# Patient Record
Sex: Male | Born: 1955
Health system: Southern US, Community
[De-identification: ages and names within clinical notes are randomized; demographics above are authoritative.]

## PROBLEM LIST (undated history)

## (undated) DIAGNOSIS — F1910 Other psychoactive substance abuse, uncomplicated: Secondary | ICD-10-CM

## (undated) DIAGNOSIS — I10 Essential (primary) hypertension: Secondary | ICD-10-CM

## (undated) DIAGNOSIS — B1920 Unspecified viral hepatitis C without hepatic coma: Secondary | ICD-10-CM

## (undated) DIAGNOSIS — I219 Acute myocardial infarction, unspecified: Secondary | ICD-10-CM

## (undated) DIAGNOSIS — E7800 Pure hypercholesterolemia, unspecified: Secondary | ICD-10-CM

## (undated) HISTORY — PX: HERNIA REPAIR: SHX51

---

## 2004-06-16 ENCOUNTER — Emergency Department (HOSPITAL_COMMUNITY): Admission: EM | Admit: 2004-06-16 | Discharge: 2004-06-16 | Payer: Self-pay | Admitting: Emergency Medicine

## 2009-01-05 ENCOUNTER — Emergency Department (HOSPITAL_BASED_OUTPATIENT_CLINIC_OR_DEPARTMENT_OTHER): Admission: EM | Admit: 2009-01-05 | Discharge: 2009-01-05 | Payer: Self-pay | Admitting: Emergency Medicine

## 2013-04-18 ENCOUNTER — Encounter (HOSPITAL_BASED_OUTPATIENT_CLINIC_OR_DEPARTMENT_OTHER): Payer: Self-pay | Admitting: Family Medicine

## 2013-04-18 ENCOUNTER — Emergency Department (HOSPITAL_BASED_OUTPATIENT_CLINIC_OR_DEPARTMENT_OTHER)
Admission: EM | Admit: 2013-04-18 | Discharge: 2013-04-18 | Disposition: A | Discharge status: Other Acute Inpatient Hospital | Payer: Medicare Other | Attending: Emergency Medicine | Admitting: Emergency Medicine

## 2013-04-18 ENCOUNTER — Emergency Department (HOSPITAL_BASED_OUTPATIENT_CLINIC_OR_DEPARTMENT_OTHER): Payer: Medicare Other

## 2013-04-18 DIAGNOSIS — I252 Old myocardial infarction: Secondary | ICD-10-CM | POA: Insufficient documentation

## 2013-04-18 DIAGNOSIS — F17200 Nicotine dependence, unspecified, uncomplicated: Secondary | ICD-10-CM | POA: Insufficient documentation

## 2013-04-18 DIAGNOSIS — IMO0002 Reserved for concepts with insufficient information to code with codable children: Secondary | ICD-10-CM | POA: Insufficient documentation

## 2013-04-18 DIAGNOSIS — Z8639 Personal history of other endocrine, nutritional and metabolic disease: Secondary | ICD-10-CM | POA: Insufficient documentation

## 2013-04-18 DIAGNOSIS — Z7982 Long term (current) use of aspirin: Secondary | ICD-10-CM | POA: Insufficient documentation

## 2013-04-18 DIAGNOSIS — Z8619 Personal history of other infectious and parasitic diseases: Secondary | ICD-10-CM | POA: Insufficient documentation

## 2013-04-18 DIAGNOSIS — I10 Essential (primary) hypertension: Secondary | ICD-10-CM | POA: Insufficient documentation

## 2013-04-18 DIAGNOSIS — Z862 Personal history of diseases of the blood and blood-forming organs and certain disorders involving the immune mechanism: Secondary | ICD-10-CM | POA: Insufficient documentation

## 2013-04-18 DIAGNOSIS — Z79899 Other long term (current) drug therapy: Secondary | ICD-10-CM | POA: Insufficient documentation

## 2013-04-18 DIAGNOSIS — L0291 Cutaneous abscess, unspecified: Secondary | ICD-10-CM

## 2013-04-18 HISTORY — DX: Essential (primary) hypertension: I10

## 2013-04-18 HISTORY — DX: Acute myocardial infarction, unspecified: I21.9

## 2013-04-18 HISTORY — DX: Unspecified viral hepatitis C without hepatic coma: B19.20

## 2013-04-18 HISTORY — DX: Pure hypercholesterolemia, unspecified: E78.00

## 2013-04-18 LAB — BASIC METABOLIC PANEL
CO2: 25 mEq/L (ref 19–32)
Creatinine, Ser: 0.9 mg/dL (ref 0.50–1.35)
GFR calc Af Amer: >90 mL/min (ref >90)
GFR calc non Af Amer: >90 mL/min (ref >90)

## 2013-04-18 LAB — CBC WITH DIFFERENTIAL/PLATELET
Basophils Relative: 0 % (ref 0–1)
Eosinophils Absolute: 0.3 10*3/uL (ref 0.0–0.7)
Eosinophils Relative: 3 % (ref 0–5)
HCT: 41.2 % (ref 39.0–52.0)
Lymphocytes Relative: 14 % (ref 12–46)
MCH: 31 pg (ref 26.0–34.0)
MCHC: 35.2 g/dL (ref 30.0–36.0)
Monocytes Absolute: 0.7 10*3/uL (ref 0.1–1.0)
Monocytes Relative: 7 % (ref 3–12)
Neutro Abs: 7.5 10*3/uL (ref 1.7–7.7)
Platelets: 123 10*3/uL — ABNORMAL LOW (ref 150–400)
RDW: 14.2 % (ref 11.5–15.5)
WBC: 9.9 10*3/uL (ref 4.0–10.5)

## 2013-04-18 MED ORDER — CLINDAMYCIN PHOSPHATE 900 MG/50ML IV SOLN
900.0000 mg | Freq: Once | INTRAVENOUS | Status: AC
  Administered 2013-04-18: 900 mg via INTRAVENOUS
  Filled 2013-04-18: qty 50

## 2013-04-18 MED ORDER — POTASSIUM CHLORIDE CRYS ER 20 MEQ PO TBCR
40.0000 meq | EXTENDED_RELEASE_TABLET | Freq: Once | ORAL | Status: AC
  Administered 2013-04-18: 40 meq via ORAL
  Filled 2013-04-18: qty 2

## 2013-04-18 MED ORDER — MORPHINE SULFATE 4 MG/ML IJ SOLN
4.0000 mg | Freq: Once | INTRAMUSCULAR | Status: AC
  Administered 2013-04-18: 4 mg via INTRAVENOUS
  Filled 2013-04-18: qty 1

## 2013-04-18 MED ORDER — SODIUM CHLORIDE 0.9 % IV BOLUS (SEPSIS)
1000.0000 mL | Freq: Once | INTRAVENOUS | Status: AC
  Administered 2013-04-18: 1000 mL via INTRAVENOUS

## 2013-04-18 MED ORDER — MORPHINE SULFATE 4 MG/ML IJ SOLN
4.0000 mg | Freq: Once | INTRAMUSCULAR | Status: DC
  Filled 2013-04-18: qty 1

## 2013-04-18 NOTE — ED Notes (Signed)
Pt somnolent at present, arouseable but no pain medication given at this time. MD aware.

## 2013-04-18 NOTE — ED Notes (Signed)
Pt c/o left pain and infection x 2 wks. Pt sts he was seen at [facility] 2 days ago and given antibiotics. Pt has open sore to left antecubital area, draining. Pt is lethargic and sts he took last pain pill at 7am.

## 2013-04-18 NOTE — ED Provider Notes (Addendum)
History     CSN: [id number]  Arrival date & time 04/18/13  9562   First MD Initiated Contact with Patient 04/18/13 1029      Chief Complaint  Patient presents with  . Wound Infection    (Consider location/radiation/quality/duration/timing/severity/associated sxs/prior treatment) HPI ComPlains of open draining painful wound to the left elbow for the past 1.5 weeks. Patient seen by Dr. Duffy Rhody yesterday prescribed Keflex and, without relief. He treated himself with hydrocodone 7.5 mg tablets this morning He reports hospitalization was recommended which he declined. Pain is worse with movement, moderate to severe. No other associated symptoms. Nonradiating Denies fever denies shortness of breath denies nausea Past Medical History  Diagnosis Date  . MI (myocardial infarction)   . Hypertension   . High cholesterol   . Hepatitis C     Past Surgical History  Procedure Laterality Date  . Hernia repair      No family history on file.  History  Substance Use Topics  . Smoking status: Current Every Day Smoker  . Smokeless tobacco: Not on file  . Alcohol Use: Yes   Denies illicit drug use   Review of Systems  Constitutional: Negative.   HENT: Negative.   Respiratory: Negative.   Cardiovascular: Negative.   Gastrointestinal: Negative.   Musculoskeletal: Negative.   Skin: Positive for wound.       Draining wound from left forearm  Neurological: Negative.   Psychiatric/Behavioral: Negative.     Allergies  Review of patient's allergies indicates no known allergies.  Home Medications   Current Outpatient Rx  Name  Route  Sig  Dispense  Refill  . aspirin 81 MG tablet   Oral   Take 81 mg by mouth daily.         . cephALEXin (KEFLEX) 500 MG capsule   Oral   Take 500 mg by mouth 4 (four) times daily.         . cyclobenzaprine (FLEXERIL) 10 MG tablet   Oral   Take 10 mg by mouth 3 (three) times daily as needed for muscle spasms.         . hydrochlorothiazide  (MICROZIDE) 12.5 MG capsule   Oral   Take 12.5 mg by mouth daily.         Marland Kitchen HYDROcodone-acetaminophen (NORCO) 7.5-325 MG per tablet   Oral   Take 1 tablet by mouth every 6 (six) hours as needed for pain.         . meloxicam (MOBIC) 15 MG tablet   Oral   Take 15 mg by mouth daily.         . metoprolol succinate (TOPROL-XL) 25 MG 24 hr tablet   Oral   Take 25 mg by mouth daily.         Marland Kitchen NIFEdipine (PROCARDIA) 10 MG capsule   Oral   Take 10 mg by mouth 3 (three) times daily.         Marland Kitchen oxyCODONE-acetaminophen (ENDOCET) 10-325 MG per tablet   Oral   Take 1 tablet by mouth every 4 (four) hours as needed for pain.         Marland Kitchen oxyCODONE-acetaminophen (PERCOCET) 10-325 MG per tablet   Oral   Take 1 tablet by mouth every 4 (four) hours as needed for pain.         Marland Kitchen sulfamethoxazole-trimethoprim (BACTRIM DS) 800-160 MG per tablet   Oral   Take 1 tablet by mouth 2 (two) times daily.         Marland Kitchen  tiZANidine (ZANAFLEX) 4 MG tablet   Oral   Take 4 mg by mouth every 6 (six) hours as needed.           BP 110/68  Pulse 79  Temp(Src) 97.7 F (36.5 C) (Oral)  Resp 16  Ht 5\' 11"  (1.803 m)  Wt 187 lb (84.823 kg)  BMI 26.09 kg/m2  SpO2 95%  Physical Exam  Nursing note and vitals reviewed. Constitutional: He appears well-developed and well-nourished. No distress.  Sleepy easily arousable to verbal stimuli  HENT:  Head: Normocephalic and atraumatic.  Eyes: Conjunctivae are normal. Pupils are equal, round, and reactive to light.  Neck: Neck supple. No tracheal deviation present. No thyromegaly present.  Cardiovascular: Normal rate and regular rhythm.   No murmur heard. Pulmonary/Chest: Effort normal and breath sounds normal.  Abdominal: Soft. Bowel sounds are normal. He exhibits no distension. There is no tenderness.  Musculoskeletal: Normal range of motion. He exhibits no edema and no tenderness.  Left upper extremity there is a 3 cm diameter open draining wound  draining cream-colored pus surrounded by approximately 10 cm hardened area which is tender. Upper arm is red and at the posterior aspect approximate 10 cm distal to the axilla down to the elbow. Proximal forearm is red approximate 5 cm distal to the elbow. There is a healing abrasion approximately 2 cm at the distal ulnar aspect of the forearm Full range of motion radial pulse 2+ good capillary refill. No axillary nodes .all other extremities without contusion abrasion or tenderness neurovascularly intact  Neurological: He is alert. Coordination normal.  Skin: Skin is warm and dry. No rash noted.  Psychiatric: He has a normal mood and affect.    ED Course  Procedures (including critical care time)  Labs Reviewed - No data to display No results found.   No diagnosis found. Results for orders placed during the hospital encounter of 04/18/13  CBC WITH DIFFERENTIAL      Result Value Range   WBC 9.9  4.0 - 10.5 K/uL   RBC 4.68  4.22 - 5.81 MIL/uL   Hemoglobin 14.5  13.0 - 17.0 g/dL   HCT 56.2  13.0 - 86.5 %   MCV 88.0  78.0 - 100.0 fL   MCH 31.0  26.0 - 34.0 pg   MCHC 35.2  30.0 - 36.0 g/dL   RDW 78.4  69.6 - 29.5 %   Platelets 123 (*) 150 - 400 K/uL   Neutrophils Relative 76  43 - 77 %   Neutro Abs 7.5  1.7 - 7.7 K/uL   Lymphocytes Relative 14  12 - 46 %   Lymphs Abs 1.4  0.7 - 4.0 K/uL   Monocytes Relative 7  3 - 12 %   Monocytes Absolute 0.7  0.1 - 1.0 K/uL   Eosinophils Relative 3  0 - 5 %   Eosinophils Absolute 0.3  0.0 - 0.7 K/uL   Basophils Relative 0  0 - 1 %   Basophils Absolute 0.0  0.0 - 0.1 K/uL  BASIC METABOLIC PANEL      Result Value Range   Sodium 141  135 - 145 mEq/L   Potassium 2.7 (*) 3.5 - 5.1 mEq/L   Chloride 107  96 - 112 mEq/L   CO2 25  19 - 32 mEq/L   Glucose, Bld 112 (*) 70 - 99 mg/dL   BUN 11  6 - 23 mg/dL   Creatinine, Ser 2.84  0.50 - 1.35 mg/dL   Calcium  7.7 (*) 8.4 - 10.5 mg/dL   GFR calc non Af Amer >90  >90 mL/min   GFR calc Af Amer >90  >90  mL/min   Dg Elbow Complete Left  04/18/2013  *RADIOLOGY REPORT*  Clinical Data: Infected open wound  LEFT ELBOW - COMPLETE 3+ VIEW  Comparison: None.  Findings: No fracture or dislocation is noted.  Joint spaces are intact. No soft tissue abnormality is noted.  No radiopaque foreign body is noted.  Spurring of the olecranon is noted.  IMPRESSION: No acute abnormality seen in left elbow.   Original Report Authenticated By: Lupita Raider.,  M.D.     Xray viewed by me  MDM  Patient requires admission for IV antibiotics. [facility] Hospice could consult him as needed. Spoke with Dr.  Memory Dance, hospitalist physician who accepts patient in transfer. Intravenous clindamycin , and potassium ordered prior to discharge Dx #1 abscess left elbow #2 hypokalemia #3 hypocalcemia #4 thrombocytopenia        Doug Sou, MD 04/18/13 1243  Doug Sou, MD 04/18/13 1244 125 1:25 PM patient requesting pain medicine. Morphine ordered  1:05 PM pain improved after treatment with morphine patient is alert and appropriate Glasgow Coma Score 15  Doug Sou, MD 04/18/13 1408

## 2015-08-07 ENCOUNTER — Encounter (HOSPITAL_BASED_OUTPATIENT_CLINIC_OR_DEPARTMENT_OTHER): Payer: Self-pay | Admitting: *Deleted

## 2015-08-07 ENCOUNTER — Emergency Department (HOSPITAL_BASED_OUTPATIENT_CLINIC_OR_DEPARTMENT_OTHER): Payer: Medicare Other

## 2015-08-07 ENCOUNTER — Emergency Department (HOSPITAL_BASED_OUTPATIENT_CLINIC_OR_DEPARTMENT_OTHER)
Admission: EM | Admit: 2015-08-07 | Discharge: 2015-08-07 | Disposition: A | Discharge status: Home / Self Care | Payer: Medicare Other | Attending: Emergency Medicine | Admitting: Emergency Medicine

## 2015-08-07 DIAGNOSIS — Y998 Other external cause status: Secondary | ICD-10-CM | POA: Diagnosis not present

## 2015-08-07 DIAGNOSIS — Y9289 Other specified places as the place of occurrence of the external cause: Secondary | ICD-10-CM | POA: Diagnosis not present

## 2015-08-07 DIAGNOSIS — Z791 Long term (current) use of non-steroidal anti-inflammatories (NSAID): Secondary | ICD-10-CM | POA: Insufficient documentation

## 2015-08-07 DIAGNOSIS — Z7982 Long term (current) use of aspirin: Secondary | ICD-10-CM | POA: Diagnosis not present

## 2015-08-07 DIAGNOSIS — S8991XA Unspecified injury of right lower leg, initial encounter: Secondary | ICD-10-CM | POA: Diagnosis present

## 2015-08-07 DIAGNOSIS — I252 Old myocardial infarction: Secondary | ICD-10-CM | POA: Insufficient documentation

## 2015-08-07 DIAGNOSIS — Z8639 Personal history of other endocrine, nutritional and metabolic disease: Secondary | ICD-10-CM | POA: Diagnosis not present

## 2015-08-07 DIAGNOSIS — M25569 Pain in unspecified knee: Secondary | ICD-10-CM

## 2015-08-07 DIAGNOSIS — W010XXA Fall on same level from slipping, tripping and stumbling without subsequent striking against object, initial encounter: Secondary | ICD-10-CM | POA: Diagnosis not present

## 2015-08-07 DIAGNOSIS — Z792 Long term (current) use of antibiotics: Secondary | ICD-10-CM | POA: Insufficient documentation

## 2015-08-07 DIAGNOSIS — I10 Essential (primary) hypertension: Secondary | ICD-10-CM | POA: Insufficient documentation

## 2015-08-07 DIAGNOSIS — Z8619 Personal history of other infectious and parasitic diseases: Secondary | ICD-10-CM | POA: Diagnosis not present

## 2015-08-07 DIAGNOSIS — Y9389 Activity, other specified: Secondary | ICD-10-CM | POA: Diagnosis not present

## 2015-08-07 DIAGNOSIS — Z79899 Other long term (current) drug therapy: Secondary | ICD-10-CM | POA: Diagnosis not present

## 2015-08-07 DIAGNOSIS — S8992XA Unspecified injury of left lower leg, initial encounter: Secondary | ICD-10-CM | POA: Insufficient documentation

## 2015-08-07 DIAGNOSIS — Z720 Tobacco use: Secondary | ICD-10-CM | POA: Diagnosis not present

## 2015-08-07 MED ORDER — DICLOFENAC EPOLAMINE 1.3 % TD PTCH: 1.0000 | MEDICATED_PATCH | Freq: Two times a day (BID) | TRANSDERMAL | Status: AC

## 2015-08-07 MED ORDER — NAPROXEN 250 MG PO TABS
500.0000 mg | ORAL_TABLET | Freq: Once | ORAL | Status: AC
  Administered 2015-08-07: 500 mg via ORAL
  Filled 2015-08-07: qty 2

## 2015-08-07 MED ORDER — METHOCARBAMOL 500 MG PO TABS
1000.0000 mg | ORAL_TABLET | Freq: Once | ORAL | Status: AC
  Administered 2015-08-07: 1000 mg via ORAL
  Filled 2015-08-07: qty 2

## 2015-08-07 NOTE — ED Notes (Signed)
Pt sitting in w/c, family at [facility], back from xray, pending results, alert, NAD, calm, interactive, drinking coffee, c/o bilateral knee pain, denies other areas of pain or other sx. "worse with movement, walking or standing". No meds PTA. Skin, CMS and ROM. Guarding movements. No obvious swelling, markings or deformity. H/o L knee surgery.

## 2015-08-07 NOTE — ED Notes (Signed)
Pt "tripped and fell" this am on a hardwoor floor.  He struck his knees and has pain in both knees now.

## 2015-08-07 NOTE — ED Notes (Signed)
Called pt x2, checked in waiting room, checked outside and called x-ray.  Can not find pt

## 2015-08-07 NOTE — ED Provider Notes (Signed)
CSN: [id number]     Arrival date & time 08/07/15  2113 History  This chart was scribed for Kareem Cathey, MD by Placido Sou, ED scribe. This patient was seen in room MHFT1/MHFT1 and the patient's care was started at 11:11 PM.   Chief Complaint  Patient presents with  . Fall   Patient is a 59 y.o. male presenting with fall. The history is provided by the patient. No language interpreter was used.  Fall This is a new problem. The current episode started 12 to 24 hours ago. The problem occurs constantly. The problem has not changed since onset.Pertinent negatives include no chest pain, no abdominal pain, no headaches and no shortness of breath. The symptoms are aggravated by walking and standing. Nothing relieves the symptoms. He has tried rest for the symptoms. The treatment provided no relief.   HPI Comments: Thomas White is a 59 y.o. male who presents to the Emergency Department complaining of constant, mild, bilateral knee pain with onset at 11:00 am this morning. Pt notes tripping and falling on a hardwood floor landing on his knees resulting in his current symptoms. He notes a shx to his left knee that was 20 years ago. He denies any known drug allergies.   Past Medical History  Diagnosis Date  . MI (myocardial infarction)   . Hypertension   . High cholesterol   . Hepatitis C    Past Surgical History  Procedure Laterality Date  . Hernia repair     No family history on file. Social History  Substance Use Topics  . Smoking status: Current Every Day Smoker  . Smokeless tobacco: None  . Alcohol Use: Yes    Review of Systems  Respiratory: Negative for shortness of breath.   Cardiovascular: Negative for chest pain.  Gastrointestinal: Negative for abdominal pain.  Musculoskeletal: Positive for arthralgias.  Skin: Negative for wound.  Neurological: Negative for headaches.  All other systems reviewed and are negative.  Allergies  Review of patient's allergies indicates no  known allergies.  Home Medications   Prior to Admission medications   Medication Sig Start Date End Date Taking? Authorizing Provider  aspirin 81 MG tablet Take 81 mg by mouth daily.   Yes Historical Provider, MD  cyclobenzaprine (FLEXERIL) 10 MG tablet Take 10 mg by mouth 3 (three) times daily as needed for muscle spasms.   Yes Historical Provider, MD  hydrochlorothiazide (MICROZIDE) 12.5 MG capsule Take 12.5 mg by mouth daily.   Yes Historical Provider, MD  lisinopril (PRINIVIL,ZESTRIL) 40 MG tablet Take 20 mg by mouth daily.   Yes Historical Provider, MD  meloxicam (MOBIC) 15 MG tablet Take 15 mg by mouth daily.   Yes Historical Provider, MD  NIFEdipine (PROCARDIA) 10 MG capsule Take 10 mg by mouth 3 (three) times daily.   Yes Historical Provider, MD  tiZANidine (ZANAFLEX) 4 MG tablet Take 4 mg by mouth every 6 (six) hours as needed.   Yes Historical Provider, MD  cephALEXin (KEFLEX) 500 MG capsule Take 500 mg by mouth 4 (four) times daily.    Historical Provider, MD  HYDROcodone-acetaminophen (NORCO) 7.5-325 MG per tablet Take 1 tablet by mouth every 6 (six) hours as needed for pain.    Historical Provider, MD  metoprolol succinate (TOPROL-XL) 25 MG 24 hr tablet Take 25 mg by mouth daily.    Historical Provider, MD  oxyCODONE-acetaminophen (ENDOCET) 10-325 MG per tablet Take 1 tablet by mouth every 4 (four) hours as needed for pain.  Historical Provider, MD  oxyCODONE-acetaminophen (PERCOCET) 10-325 MG per tablet Take 1 tablet by mouth every 4 (four) hours as needed for pain.    Historical Provider, MD  sulfamethoxazole-trimethoprim (BACTRIM DS) 800-160 MG per tablet Take 1 tablet by mouth 2 (two) times daily.    Historical Provider, MD   BP 136/78 mmHg  Pulse 74  Temp(Src) 98.3 F (36.8 C) (Oral)  Resp 16  Ht 5\' 11"  (1.803 m)  Wt 198 lb (89.812 kg)  BMI 27.63 kg/m2  SpO2 97% Physical Exam  Constitutional: He is oriented to person, place, and time. He appears well-developed and  well-nourished. No distress.  HENT:  Head: Normocephalic and atraumatic.  Mouth/Throat: Oropharynx is clear and moist.  Eyes: EOM are normal. Pupils are equal, round, and reactive to light.  Neck: Normal range of motion. Neck supple.  Cardiovascular: Normal rate, regular rhythm, normal heart sounds and intact distal pulses.   Pulmonary/Chest: Effort normal and breath sounds normal. No respiratory distress. He has no wheezes. He has no rales.  Abdominal: Soft. Bowel sounds are normal. There is no tenderness. There is no rebound and no guarding.  Musculoskeletal: Normal range of motion. He exhibits no edema or tenderness.       Right knee: Normal.       Left knee: Normal.       Right ankle: Normal.       Left ankle: Normal.  Negative anterior and posterior drawer test of B knees; DTR's intact; patellar tendons in both intact; compartments soft; 2+ PT pulses equal bilaterally  Neurological: He is alert and oriented to person, place, and time. He has normal reflexes.  Skin: Skin is warm and dry.  Psychiatric: He has a normal mood and affect.  Nursing note and vitals reviewed.  ED Course  Procedures  DIAGNOSTIC STUDIES: Oxygen Saturation is 97% on RA, normal by my interpretation.    COORDINATION OF CARE: 11:14 PM Discussed treatment plan with pt at bedside and pt agreed to plan.  Labs Review Labs Reviewed - No data to display  Imaging Review Dg Knee Complete 4 Views Left  08/07/2015   CLINICAL DATA:  Fall on wood floor.  Bilateral knee pain.  EXAM: LEFT KNEE - COMPLETE 4+ VIEW  COMPARISON:  None.  FINDINGS: The knee is located. Mild degenerative changes are present in the medial and patellofemoral compartments. A small joint effusion is present. There is no acute fracture. Atherosclerotic changes are noted within the popliteal artery.  IMPRESSION: 1. Small joint effusion without evidence for fracture. Ligamentous injury is not excluded. 2. Atherosclerosis.   Electronically Signed   By:  Marin Roberts M.D.   On: 08/07/2015 22:26   Dg Knee Complete 4 Views Right  08/07/2015   CLINICAL DATA:  Fall onto wood floor.  EXAM: RIGHT KNEE - COMPLETE 4+ VIEW  COMPARISON:  None.  FINDINGS: Right knee is located. Degenerative changes are noted within the medial and patellofemoral compartments of the knee. There is a remote fracture along the medial condyle. Atherosclerotic changes are noted in the popliteal artery. A small joint effusion is present.  IMPRESSION: 1. Moderate degenerative change in the medial and patellofemoral compartments. 2. Small joint effusion without acute osseous abnormality. Ligamentous injury is not excluded. 3. Remote fracture along the medial condyle.   Electronically Signed   By: Marin Roberts M.D.   On: 08/07/2015 22:28   I, Roi Jafari, MD, personally reviewed and evaluated these images and lab results as part of my medical  decision-making.   EKG Interpretation None      MDM   Final diagnoses:  None  Informed patient of old fracture in the knee and all xray findings.    Voltaren patches.  Patient is taking muscle relaxers and pain medication at this time.  Ice and elevate the legs.  Follow up with your PMD.    I personally performed the services described in this documentation, which was scribed in my presence. The recorded information has been reviewed and is accurate.     Cy Blamer, MD 08/08/15 [phone]

## 2015-08-07 NOTE — Discharge Instructions (Signed)
Cryotherapy °Cryotherapy means treatment with cold. Ice or gel packs can be used to reduce both pain and swelling. Ice is the most helpful within the first 24 to 48 hours after an injury or flare-up from overusing a muscle or joint. Sprains, strains, spasms, burning pain, shooting pain, and aches can all be eased with ice. Ice can also be used when recovering from surgery. Ice is effective, has very few side effects, and is safe for most people to use. °PRECAUTIONS  °Ice is not a safe treatment option for people with: °· Raynaud phenomenon. This is a condition affecting small blood vessels in the extremities. Exposure to cold may cause your problems to return. °· Cold hypersensitivity. There are many forms of cold hypersensitivity, including: °¨ Cold urticaria. Red, itchy hives appear on the skin when the tissues begin to warm after being iced. °¨ Cold erythema. This is a red, itchy rash caused by exposure to cold. °¨ Cold hemoglobinuria. Red blood cells break down when the tissues begin to warm after being iced. The hemoglobin that carry oxygen are passed into the urine because they cannot combine with blood proteins fast enough. °· Numbness or altered sensitivity in the area being iced. °If you have any of the following conditions, do not use ice until you have discussed cryotherapy with your caregiver: °· Heart conditions, such as arrhythmia, angina, or chronic heart disease. °· High blood pressure. °· Healing wounds or open skin in the area being iced. °· Current infections. °· Rheumatoid arthritis. °· Poor circulation. °· Diabetes. °Ice slows the blood flow in the region it is applied. This is beneficial when trying to stop inflamed tissues from spreading irritating chemicals to surrounding tissues. However, if you expose your skin to cold temperatures for too long or without the proper protection, you can damage your skin or nerves. Watch for signs of skin damage due to cold. °HOME CARE INSTRUCTIONS °Follow  these tips to use ice and cold packs safely. °· Place a dry or damp towel between the ice and skin. A damp towel will cool the skin more quickly, so you may need to shorten the time that the ice is used. °· For a more rapid response, add gentle compression to the ice. °· Ice for no more than 10 to 20 minutes at a time. The bonier the area you are icing, the less time it will take to get the benefits of ice. °· Check your skin after 5 minutes to make sure there are no signs of a poor response to cold or skin damage. °· Rest 20 minutes or more between uses. °· Once your skin is numb, you can end your treatment. You can test numbness by very lightly touching your skin. The touch should be so light that you do not see the skin dimple from the pressure of your fingertip. When using ice, most people will feel these normal sensations in this order: cold, burning, aching, and numbness. °· Do not use ice on someone who cannot communicate their responses to pain, such as small children or people with dementia. °HOW TO MAKE AN ICE PACK °Ice packs are the most common way to use ice therapy. Other methods include ice massage, ice baths, and cryosprays. Muscle creams that cause a cold, tingly feeling do not offer the same benefits that ice offers and should not be used as a substitute unless recommended by your caregiver. °To make an ice pack, do one of the following: °· Place crushed ice or a   bag of frozen vegetables in a sealable plastic bag. Squeeze out the excess air. Place this bag inside another plastic bag. Slide the bag into a pillowcase or place a damp towel between your skin and the bag. °· Mix 3 parts water with 1 part rubbing alcohol. Freeze the mixture in a sealable plastic bag. When you remove the mixture from the freezer, it will be slushy. Squeeze out the excess air. Place this bag inside another plastic bag. Slide the bag into a pillowcase or place a damp towel between your skin and the bag. °SEEK MEDICAL CARE  IF: °· You develop white spots on your skin. This may give the skin a blotchy (mottled) appearance. °· Your skin turns blue or pale. °· Your skin becomes waxy or hard. °· Your swelling gets worse. °MAKE SURE YOU:  °· Understand these instructions. °· Will watch your condition. °· Will get help right away if you are not doing well or get worse. °Document Released: 08/07/2011 Document Revised: 04/27/2014 Document Reviewed: 08/07/2011 °[employer]® Patient Information ©2015 [employer]. This information is not intended to replace advice given to you by your health care provider. Make sure you discuss any questions you have with your health care provider. ° °

## 2015-08-08 ENCOUNTER — Encounter (HOSPITAL_BASED_OUTPATIENT_CLINIC_OR_DEPARTMENT_OTHER): Payer: Self-pay | Admitting: Emergency Medicine

## 2017-12-13 ENCOUNTER — Other Ambulatory Visit: Payer: Self-pay

## 2017-12-13 ENCOUNTER — Emergency Department (HOSPITAL_BASED_OUTPATIENT_CLINIC_OR_DEPARTMENT_OTHER)
Admission: EM | Admit: 2017-12-13 | Discharge: 2017-12-13 | Disposition: A | Discharge status: Home / Self Care | Payer: Medicare Other | Attending: Emergency Medicine | Admitting: Emergency Medicine

## 2017-12-13 ENCOUNTER — Encounter (HOSPITAL_BASED_OUTPATIENT_CLINIC_OR_DEPARTMENT_OTHER): Payer: Self-pay

## 2017-12-13 DIAGNOSIS — Z7982 Long term (current) use of aspirin: Secondary | ICD-10-CM | POA: Diagnosis not present

## 2017-12-13 DIAGNOSIS — I252 Old myocardial infarction: Secondary | ICD-10-CM | POA: Diagnosis not present

## 2017-12-13 DIAGNOSIS — L02413 Cutaneous abscess of right upper limb: Secondary | ICD-10-CM | POA: Insufficient documentation

## 2017-12-13 DIAGNOSIS — Z79899 Other long term (current) drug therapy: Secondary | ICD-10-CM | POA: Diagnosis not present

## 2017-12-13 DIAGNOSIS — F17210 Nicotine dependence, cigarettes, uncomplicated: Secondary | ICD-10-CM | POA: Insufficient documentation

## 2017-12-13 DIAGNOSIS — I10 Essential (primary) hypertension: Secondary | ICD-10-CM | POA: Diagnosis not present

## 2017-12-13 MED ORDER — HYDROCODONE-ACETAMINOPHEN 5-325 MG PO TABS
1.0000 | ORAL_TABLET | Freq: Once | ORAL | Status: AC
  Administered 2017-12-13: 1 via ORAL
  Filled 2017-12-13: qty 1

## 2017-12-13 MED ORDER — SULFAMETHOXAZOLE-TRIMETHOPRIM 800-160 MG PO TABS: 1.0000 | ORAL_TABLET | Freq: Two times a day (BID) | ORAL | 0 refills | Status: AC

## 2017-12-13 MED ORDER — HYDROCODONE-ACETAMINOPHEN 5-325 MG PO TABS: ORAL_TABLET | ORAL | 0 refills | Status: DC

## 2017-12-13 MED ORDER — SULFAMETHOXAZOLE-TRIMETHOPRIM 800-160 MG PO TABS
1.0000 | ORAL_TABLET | Freq: Once | ORAL | Status: AC
  Administered 2017-12-13: 1 via ORAL
  Filled 2017-12-13: qty 1

## 2017-12-13 NOTE — ED Provider Notes (Signed)
[facility] EMERGENCY DEPARTMENT Provider Note   CSN: [id number] Arrival date & time: 12/13/17  1859     History   Chief Complaint No chief complaint on file.   HPI Thomas White is a 61 y.o. male.  Patient with h/o MI, hep C -- presents with ongoing left forearm abscess and drainage.  Patient was initially seen at [facility] 2 weeks ago.  He had the area incised and was started on clindamycin and had the wound packed.  He had 2 rechecks, the most recent one week ago, which reported improvement.  However patient over the past several days have had increased pain and drainage once again from the wound.  No fevers, nausea or vomiting.  No difficulty moving his wrist or fingers.  No redness or streaking.  Patient does not have a history of diabetes or immunocompromise.  Here tonight for recheck.  He has not seen his primary care physician for this.      Past Medical History:  Diagnosis Date  . Hepatitis C   . High cholesterol   . Hypertension   . MI (myocardial infarction) (HCC)     There are no active problems to display for this patient.   Past Surgical History:  Procedure Laterality Date  . HERNIA REPAIR         Home Medications    Prior to Admission medications   Medication Sig Start Date End Date Taking? Authorizing Provider  aspirin 81 MG tablet Take 81 mg by mouth daily.    [provider]  cephALEXin (KEFLEX) 500 MG capsule Take 500 mg by mouth 4 (four) times daily.    [provider]  cyclobenzaprine (FLEXERIL) 10 MG tablet Take 10 mg by mouth 3 (three) times daily as needed for muscle spasms.    [provider]  diclofenac (FLECTOR) 1.3 % PTCH Place 1 patch onto the skin 2 (two) times daily. 08/07/15   Palumbo, April, MD  hydrochlorothiazide (MICROZIDE) 12.5 MG capsule Take 12.5 mg by mouth daily.    [provider]  lisinopril (PRINIVIL,ZESTRIL) 40 MG tablet Take 20 mg by mouth daily.    [provider]  meloxicam (MOBIC) 15 MG tablet Take 15 mg by mouth daily.    [provider]  metoprolol succinate (TOPROL-XL) 25 MG 24 hr tablet Take 25 mg by mouth daily.    [provider]  NIFEdipine (PROCARDIA) 10 MG capsule Take 10 mg by mouth 3 (three) times daily.    [provider]  tiZANidine (ZANAFLEX) 4 MG tablet Take 4 mg by mouth every 6 (six) hours as needed.    [provider]    Family History No family history on file.  Social History Social History   Tobacco Use  . Smoking status: Current Every Day Smoker  . Smokeless tobacco: Never Used  Substance Use Topics  . Alcohol use: Yes    Comment: rare  . Drug use: No     Allergies   Patient has no known allergies.   Review of Systems Review of Systems  Constitutional: Negative for fever.  Gastrointestinal: Negative for nausea and vomiting.  Skin: Negative for color change.       Positive for abscess  Hematological: Negative for adenopathy.     Physical Exam Updated Vital Signs BP 100/72 (BP Location: Left Arm)   Pulse 86   Temp 98.4 F (36.9 C) (Oral)   Resp 14   SpO2 97%   Physical  Exam  Constitutional: He appears well-developed and well-nourished.  HENT:  Head: Normocephalic and atraumatic.  Eyes: Conjunctivae are normal.  Neck: Normal range of motion. Neck supple.  Pulmonary/Chest: No respiratory distress.  Neurological: He is alert.  Skin: Skin is warm and dry.  3 cm area of induration with approximately 7 mm open draining incision just distal to the right antecubital fossa.  No pulsatile mass felt.  Palpation around the area causes purulent drainage to be expressed.  Full range of motion of the wrist and hand without pain.  No lymphangitis or surrounding cellulitis.  Psychiatric: He has a normal mood and affect.  Nursing note and vitals reviewed.    ED Treatments / Results  Labs (all labs ordered are listed, but only abnormal results are  displayed) Labs Reviewed  AEROBIC CULTURE (SUPERFICIAL SPECIMEN)    EKG  EKG Interpretation None       Radiology No results found.  Procedures Procedures (including critical care time)  Medications Ordered in ED Medications  sulfamethoxazole-trimethoprim (BACTRIM DS,SEPTRA DS) 800-160 MG per tablet 1 tablet (1 tablet Oral Given 12/13/17 2038)  HYDROcodone-acetaminophen (NORCO/VICODIN) 5-325 MG per tablet 1 tablet (1 tablet Oral Given 12/13/17 2038)     Initial Impression / Assessment and Plan / ED Course  I have reviewed the triage vital signs and the nursing notes.  Pertinent labs & imaging results that were available during my care of the patient were reviewed by me and considered in my medical decision making (see chart for details).     Patient seen and examined. Medications ordered.   Vital signs reviewed and are as follows: BP 100/72 (BP Location: Left Arm)   Pulse 86   Temp 98.4 F (36.9 C) (Oral)   Resp 14   SpO2 97%   Given persistent infection, wound culture taken tonight.  Patient will be switched from clindamycin to Bactrim.  Will have patient return in 48 hours to follow-up on culture results and for recheck of the wound.  I would not re-incised the wound given that is open and draining.  Counseled on warm soaks at home.  Counseled to return sooner with worsening pain, swelling, fever or new symptoms.  Patient counseled on use of narcotic pain medications. Counseled not to combine these medications with others containing tylenol. Urged not to drink alcohol, drive, or perform any other activities that requires focus while taking these medications. The patient verbalizes understanding and agrees with the plan.   Final Clinical Impressions(s) / ED Diagnoses   Final diagnoses:  Abscess of forearm, right   Patients with abscess of forearm which has been slow to heal, now with worsening.  Patient had previous incision.  Wound is still open and draining.   Patient will be placed on Bactrim and told to discontinue clindamycin.  Wound culture sent.  Follow-up in 48 hours for recheck and culture follow-up indicated.  No signs of lymphangitis, spreading cellulitis, necrotizing fasciitis, or sepsis.  Patient is not diabetic.  I do not feel strongly that lab work is indicated at this point.   ED Discharge Orders        Ordered    HYDROcodone-acetaminophen (NORCO/VICODIN) 5-325 MG tablet     12/13/17 2042    sulfamethoxazole-trimethoprim (BACTRIM DS,SEPTRA DS) 800-160 MG tablet  2 times daily     12/13/17 2042       Renne CriglerGeiple, Kiahna Banghart, Cordelia Poche-C 12/13/17 2044    Rolland PorterJames, Mark, MD 12/13/17 [phone]

## 2017-12-13 NOTE — ED Triage Notes (Signed)
Pt [facility]/o area to right UE-dx with insect bite with I&D and abx-pt first noticed area approx 12/8-states area is still painful-NAD-steady gait

## 2017-12-13 NOTE — Discharge Instructions (Signed)
Please read and follow all provided instructions.  Your diagnoses today include:  1. Abscess of forearm, right     Tests performed today include:  Vital signs. See below for your results today.   Medications prescribed:   Bactrim (trimethoprim/sulfamethoxazole) - antibiotic  You have been prescribed an antibiotic medicine: take the entire course of medicine even if you are feeling better. Stopping early can cause the antibiotic not to work.   Vicodin (hydrocodone/acetaminophen) - narcotic pain medication  DO NOT drive or perform any activities that require you to be awake and alert because this medicine can make you drowsy. BE VERY CAREFUL not to take multiple medicines containing Tylenol (also called acetaminophen). Doing so can lead to an overdose which can damage your liver and cause liver failure and possibly death.  Take any prescribed medications only as directed.   Home care instructions:   Follow any educational materials contained in this packet  Follow-up instructions: Return to the Emergency Department in 48 hours for a recheck.   Return instructions:  Return to the Emergency Department if you have:  Fever  Worsening symptoms  Worsening pain  Worsening swelling  Redness of the skin that moves away from the affected area, especially if it streaks away from the affected area   Any other emergent concerns  Your vital signs today were: BP 100/72 (BP Location: Left Arm)    Pulse 86    Temp 98.4 F (36.9 C) (Oral)    Resp 14    SpO2 97%  If your blood pressure (BP) was elevated above 135/85 this visit, please have this repeated by your doctor within one month. --------------

## 2017-12-13 NOTE — ED Notes (Signed)
Pt discharged to home with family. NAD.  

## 2017-12-15 ENCOUNTER — Emergency Department (HOSPITAL_BASED_OUTPATIENT_CLINIC_OR_DEPARTMENT_OTHER)
Admission: EM | Admit: 2017-12-15 | Discharge: 2017-12-15 | Disposition: A | Discharge status: Home / Self Care | Payer: Medicare Other | Attending: Emergency Medicine | Admitting: Emergency Medicine

## 2017-12-15 ENCOUNTER — Encounter (HOSPITAL_BASED_OUTPATIENT_CLINIC_OR_DEPARTMENT_OTHER): Payer: Self-pay | Admitting: *Deleted

## 2017-12-15 DIAGNOSIS — Z7982 Long term (current) use of aspirin: Secondary | ICD-10-CM | POA: Diagnosis not present

## 2017-12-15 DIAGNOSIS — F17200 Nicotine dependence, unspecified, uncomplicated: Secondary | ICD-10-CM | POA: Diagnosis not present

## 2017-12-15 DIAGNOSIS — L02413 Cutaneous abscess of right upper limb: Secondary | ICD-10-CM | POA: Insufficient documentation

## 2017-12-15 DIAGNOSIS — R2231 Localized swelling, mass and lump, right upper limb: Secondary | ICD-10-CM | POA: Diagnosis present

## 2017-12-15 DIAGNOSIS — I10 Essential (primary) hypertension: Secondary | ICD-10-CM | POA: Insufficient documentation

## 2017-12-15 DIAGNOSIS — Z79899 Other long term (current) drug therapy: Secondary | ICD-10-CM | POA: Insufficient documentation

## 2017-12-15 DIAGNOSIS — L0291 Cutaneous abscess, unspecified: Secondary | ICD-10-CM

## 2017-12-15 DIAGNOSIS — I252 Old myocardial infarction: Secondary | ICD-10-CM | POA: Diagnosis not present

## 2017-12-15 DIAGNOSIS — Z5189 Encounter for other specified aftercare: Secondary | ICD-10-CM

## 2017-12-15 MED ORDER — LIDOCAINE-EPINEPHRINE 2 %-1:100000 IJ SOLN
20.0000 mL | Freq: Once | INTRAMUSCULAR | Status: AC
  Administered 2017-12-15: 1 mL via INTRADERMAL
  Filled 2017-12-15: qty 1

## 2017-12-15 MED ORDER — HYDROCODONE-ACETAMINOPHEN 5-325 MG PO TABS: 1.0000 | ORAL_TABLET | Freq: Four times a day (QID) | ORAL | 0 refills | Status: DC | PRN

## 2017-12-15 NOTE — ED Triage Notes (Addendum)
R anterior/proximal FA wound check, 2 lesions, "insect bites", seen Wednesday, reports "the same/ no change", still taking ABX. Rates pain 8/10. C/o pain, redness, swelling, drainage.  Alert, NAD, calm, interactive, resps e/u, speaking in clear complete sentences, no dyspnea noted, skin W&D, VSS, (denies: fever, numbness, tingling, NVD, sob, bleeding, dizziness or visual changes). States, "norco helping".

## 2017-12-15 NOTE — ED Provider Notes (Signed)
[facility] EMERGENCY DEPARTMENT Provider Note   CSN: [id number] Arrival date & time: 12/15/17  [id number]     History   Chief Complaint Chief Complaint  Patient presents with  . Wound Check    HPI Thomas White is a 61 y.o. male.  HPI 61 year old male who presents with persistent abscess and wound check. Sustained insect bites to right forearm 1-2 weeks ago.  States that he was initially seen at [facility].  Had incision and drainage of 1 of the bites on 8 December.  He was seen in the ED admits in her high 0.2 days ago for evaluation of persistent abscess.  Was placed on Bactrim, and told to discontinue clindamycin.  He still has persistent abscess.  No fevers, chills, nausea or vomiting, shortness of breath, numbness or weakness.  Denies history of diabetes, chronic steroids, or immunosuppression. Past Medical History:  Diagnosis Date  . Hepatitis C   . High cholesterol   . Hypertension   . MI (myocardial infarction) (HCC)     There are no active problems to display for this patient.   Past Surgical History:  Procedure Laterality Date  . HERNIA REPAIR         Home Medications    Prior to Admission medications   Medication Sig Start Date End Date Taking? Authorizing Provider  aspirin 81 MG tablet Take 81 mg by mouth daily.    [provider]  cephALEXin (KEFLEX) 500 MG capsule Take 500 mg by mouth 4 (four) times daily.    [provider]  cyclobenzaprine (FLEXERIL) 10 MG tablet Take 10 mg by mouth 3 (three) times daily as needed for muscle spasms.    [provider]  diclofenac (FLECTOR) 1.3 % PTCH Place 1 patch onto the skin 2 (two) times daily. 08/07/15   Palumbo, April, MD  hydrochlorothiazide (MICROZIDE) 12.5 MG capsule Take 12.5 mg by mouth daily.    [provider]  HYDROcodone-acetaminophen (NORCO/VICODIN) 5-325 MG tablet Take 1-2 tablets every 6 hours as needed for severe pain 12/13/17    Renne CriglerGeiple, Joshua, PA-C  HYDROcodone-acetaminophen (NORCO/VICODIN) 5-325 MG tablet Take 1 tablet by mouth every 6 (six) hours as needed for moderate pain or severe pain. 12/15/17   Lavera GuiseLiu, Tahesha Skeet Duo, MD  lisinopril (PRINIVIL,ZESTRIL) 40 MG tablet Take 20 mg by mouth daily.    [provider]  meloxicam (MOBIC) 15 MG tablet Take 15 mg by mouth daily.    [provider]  metoprolol succinate (TOPROL-XL) 25 MG 24 hr tablet Take 25 mg by mouth daily.    [provider]  NIFEdipine (PROCARDIA) 10 MG capsule Take 10 mg by mouth 3 (three) times daily.    [provider]  sulfamethoxazole-trimethoprim (BACTRIM DS,SEPTRA DS) 800-160 MG tablet Take 1 tablet by mouth 2 (two) times daily for 7 days. 12/13/17 12/20/17  Renne CriglerGeiple, Joshua, PA-C  tiZANidine (ZANAFLEX) 4 MG tablet Take 4 mg by mouth every 6 (six) hours as needed.    [provider]    Family History History reviewed. No pertinent family history.  Social History Social History   Tobacco Use  . Smoking status: Current Every Day Smoker  . Smokeless tobacco: Never Used  Substance Use Topics  . Alcohol use: Yes    Comment: rare  . Drug use: No     Allergies   Patient has no known allergies.   Review of Systems Review of Systems  Constitutional: Negative for fever.  Respiratory: Negative for shortness of breath.   Cardiovascular: Negative for chest pain.  Gastrointestinal: Negative for nausea and vomiting.  Skin: Positive for wound.  Allergic/Immunologic: Negative for immunocompromised state.  Neurological: Negative for weakness and numbness.  Hematological: Does not bruise/bleed easily.     Physical Exam Updated Vital Signs BP 135/85 (BP Location: Left Arm)   Pulse 71   Temp 98.8 F (37.1 C) (Oral)   Resp 18   Ht 6\' 3"  (1.905 m)   Wt 85.3 kg (188 lb)   SpO2 100%   BMI 23.50 kg/m   Physical Exam Physical Exam  Constitutional: Appears well-developed and well-nourished. No acute  distress. HENT:  Head: Normocephalic.  Eyes: Conjunctivae are normal.  Neck: Supple, normal range of motion Cardiovascular: Normal rate and intact distal pulses.   Pulmonary/Chest: Effort normal. No respiratory distress.  Abdominal: Exhibits no distension.  Musculoskeletal: Normal range of motion. Exhibits no deformity.  Neurological: Alert. Fluent speech.  Skin: Skin is warm and dry. 2 abscesses over right forearm. One measuring 2 x 3 cm and one measuring 2 x 2 cm just lateral. No significant surrounding erythema, induration.  Psychiatric: Normal mood and affect. Behavior is normal.  Nursing note and vitals reviewed.   ED Treatments / Results  Labs (all labs ordered are listed, but only abnormal results are displayed) Labs Reviewed - No data to display  EKG  EKG Interpretation None       Radiology No results found.  Procedures .Marland KitchenIncision and Drainage Date/Time: 12/15/2017 9:04 AM Performed by: Lavera Guise, MD Authorized by: Lavera Guise, MD   Consent:    Consent obtained:  Verbal   Consent given by:  Patient   Risks discussed:  Bleeding, incomplete drainage, infection and pain   Alternatives discussed:  No treatment Location:    Type:  Abscess   Size:  2 x 3 cm   Location:  Upper extremity   Upper extremity location:  Arm   Arm location:  R lower arm Pre-procedure details:    Skin preparation:  Chloraprep Anesthesia (see MAR for exact dosages):    Anesthesia method:  Local infiltration   Local anesthetic:  Lidocaine 2% WITH epi Procedure type:    Complexity:  Simple Procedure details:    Incision types:  Single straight   Incision depth:  Submucosal   Scalpel blade:  11   Wound management:  Probed and deloculated   Drainage:  Purulent and bloody   Drainage amount:  Moderate   Wound treatment:  Wound left open   Packing materials:  1/2 in gauze Post-procedure details:    Patient tolerance of procedure:  Tolerated well, no immediate  complications .Marland KitchenIncision and Drainage Date/Time: 12/15/2017 9:06 AM Performed by: Lavera Guise, MD Authorized by: Lavera Guise, MD   Consent:    Consent obtained:  Verbal   Consent given by:  Patient   Risks discussed:  Bleeding, incomplete drainage, infection and pain   Alternatives discussed:  No treatment Location:    Type:  Abscess   Size:  2 x 2 cm   Location:  Upper extremity   Upper extremity location:  Arm   Arm location:  R lower arm Pre-procedure details:    Skin preparation:  Chloraprep Anesthesia (see MAR for exact dosages):    Anesthesia method:  Local infiltration   Local anesthetic:  Lidocaine 2% WITH epi Procedure type:    Complexity:  Simple Procedure details:    Needle aspiration: no  Incision types:  Single straight   Incision depth:  Submucosal   Scalpel blade:  11   Wound management:  Probed and deloculated   Drainage:  Purulent and bloody   Drainage amount:  Scant   Wound treatment:  Wound left open   Packing materials:  1/2 in gauze Post-procedure details:    Patient tolerance of procedure:  Tolerated well, no immediate complications   (including critical care time)  Medications Ordered in ED Medications  lidocaine-EPINEPHrine (XYLOCAINE W/EPI) 2 %-1:100000 (with pres) injection 20 mL (1 mL Intradermal Given 12/15/17 0743)     Initial Impression / Assessment and Plan / ED Course  I have reviewed the triage vital signs and the nursing notes.  Pertinent labs & imaging results that were available during my care of the patient were reviewed by me and considered in my medical decision making (see chart for details).     Patient with persistent wound after 2 days of Bactrim. Appears to have persistent abscesses that requires repeat I&D. No systemic signs or symptoms of illness. Repeat I&D performed with drainage of pus. May be reaccumulation of pus that caused non-response to antibiotics.  Wound cultures from 2 days ago reviewed.  No  significant growth but some small gram-positive cocci noted.  Patient to finish course of Bactrim.  To return in 2 days for wound recheck. Strict return and follow-up instructions reviewed. He expressed understanding of all discharge instructions and felt comfortable with the plan of care.   Final Clinical Impressions(s) / ED Diagnoses   Final diagnoses:  Visit for wound check  Wound check, abscess  Abscess    ED Discharge Orders        Ordered    HYDROcodone-acetaminophen (NORCO/VICODIN) 5-325 MG tablet  Every 6 hours PRN     12/15/17 0904       Lavera GuiseLiu, Tenya Araque Duo, MD 12/15/17 [phone]

## 2017-12-15 NOTE — Discharge Instructions (Signed)
Please pull packing out in 2 days. Please return in 2-3 days for wound check (Monday or Wednesday of next week would be fine). Please finish course of Bactrim Return without fail for worsening symptoms, including fever, vomiting, worsening redness or swelling or any other symptoms concerning to you.

## 2017-12-16 LAB — AEROBIC CULTURE  (SUPERFICIAL SPECIMEN)

## 2017-12-16 LAB — AEROBIC CULTURE W GRAM STAIN (SUPERFICIAL SPECIMEN)

## 2017-12-17 ENCOUNTER — Telehealth: Payer: Self-pay | Admitting: *Deleted

## 2017-12-17 NOTE — Telephone Encounter (Signed)
Patient supposed to [facility] today [facility] for recheck of left forearm abscess.  If no improvement in symptoms, switch antibiotic to Cephalexin 500mg  BID.  Spoke with patient via phone who states he is coming [facility] today for a wound check.

## 2017-12-17 NOTE — Progress Notes (Signed)
ED Antimicrobial Stewardship Positive Culture Follow Up   Thomas DolphinDerek Q White is an 61 y.o. male who presented to [facility] on 12/13/2017 with a chief complaint of No chief complaint on file.   Recent Results (from the past 720 hour(s))  Wound or Superficial Culture     Status: None   Collection Time: 12/13/17  8:20 PM  Result Value Ref Range Status   Specimen Description ABSCESS  Final   Special Requests R UPPER ARM  Final   Gram Stain   Final    ABUNDANT WBC PRESENT, PREDOMINANTLY PMN RARE GRAM POSITIVE COCCI    Culture   Final    ABUNDANT VIRIDANS STREPTOCOCCUS CALL MICROBIOLOGY LAB IF SENSITIVITIES ARE REQUIRED. Performed at [facility], [address] [place], [place] [address]    Report Status 12/16/2017 FINAL  Final    [x]  Treated with clindamycin, organism may be resistant to prescribed antimicrobial []  Patient discharged originally without antimicrobial agent and treatment is now indicated  New antibiotic prescription: Cephalexin 500 mg BID X 7 days . [facility].   ED Provider: Lyndel SafeElizabeth Hammond, PA-C   Della GooEmily S Sinclair, PharmD, BCPS 12/17/2017, 8:30 AM Infectious Diseases Pharmacy Resident  Phone# [phone]

## 2018-02-12 ENCOUNTER — Encounter (HOSPITAL_BASED_OUTPATIENT_CLINIC_OR_DEPARTMENT_OTHER): Payer: Self-pay | Admitting: Emergency Medicine

## 2018-02-12 ENCOUNTER — Emergency Department (HOSPITAL_BASED_OUTPATIENT_CLINIC_OR_DEPARTMENT_OTHER)
Admission: EM | Admit: 2018-02-12 | Discharge: 2018-02-12 | Disposition: A | Discharge status: Home / Self Care | Payer: Medicare Other | Attending: Emergency Medicine | Admitting: Emergency Medicine

## 2018-02-12 ENCOUNTER — Other Ambulatory Visit: Payer: Self-pay

## 2018-02-12 DIAGNOSIS — F17210 Nicotine dependence, cigarettes, uncomplicated: Secondary | ICD-10-CM | POA: Insufficient documentation

## 2018-02-12 DIAGNOSIS — Z7982 Long term (current) use of aspirin: Secondary | ICD-10-CM | POA: Insufficient documentation

## 2018-02-12 DIAGNOSIS — I10 Essential (primary) hypertension: Secondary | ICD-10-CM | POA: Insufficient documentation

## 2018-02-12 DIAGNOSIS — L02413 Cutaneous abscess of right upper limb: Secondary | ICD-10-CM | POA: Insufficient documentation

## 2018-02-12 DIAGNOSIS — Z79899 Other long term (current) drug therapy: Secondary | ICD-10-CM | POA: Insufficient documentation

## 2018-02-12 DIAGNOSIS — R2231 Localized swelling, mass and lump, right upper limb: Secondary | ICD-10-CM | POA: Diagnosis present

## 2018-02-12 HISTORY — DX: Other psychoactive substance abuse, uncomplicated: F19.10

## 2018-02-12 MED ORDER — LIDOCAINE-EPINEPHRINE (PF) 2 %-1:200000 IJ SOLN
10.0000 mL | Freq: Once | INTRAMUSCULAR | Status: AC
  Administered 2018-02-12: 10 mL
  Filled 2018-02-12: qty 10

## 2018-02-12 NOTE — Discharge Instructions (Signed)
It was our pleasure to provide your ER care today - we hope that you feel better.  Keep area very clean.   Follow up with primary care doctor, or urgent care, in 2 days time for wound check and packing removal.   Return to ER if worse, new symptoms, spreading redness, increased swelling, high fevers, other concern.

## 2018-02-12 NOTE — ED Triage Notes (Signed)
Pt c/o abscess to RUE (antecubital area) since [employer]

## 2018-02-12 NOTE — ED Provider Notes (Signed)
[facility] EMERGENCY DEPARTMENT Provider Note   CSN: [id number] Arrival date & time: 02/12/18  0946     History   Chief Complaint Chief Complaint  Patient presents with  . Abscess    HPI Thomas White is a 62 y.o. male.  Patient w abscess to right arm in antecubital area. Presents for past 4-5 days.  Area is painful. Symptoms moderate, persistent, worse w palpation. Hx abscess a few cm away from current. +hx ivda. Denies fever or chills. No nv. States does not feel sick or ill.    The history is provided by the patient.  Abscess  Associated symptoms: no fever and no vomiting     Past Medical History:  Diagnosis Date  . Hepatitis C   . High cholesterol   . Hypertension   . MI (myocardial infarction) (HCC)     There are no active problems to display for this patient.   Past Surgical History:  Procedure Laterality Date  . HERNIA REPAIR         Home Medications    Prior to Admission medications   Medication Sig Start Date End Date Taking? Authorizing Provider  aspirin 81 MG tablet Take 81 mg by mouth daily.    [provider]  cephALEXin (KEFLEX) 500 MG capsule Take 500 mg by mouth 4 (four) times daily.    [provider]  cyclobenzaprine (FLEXERIL) 10 MG tablet Take 10 mg by mouth 3 (three) times daily as needed for muscle spasms.    [provider]  diclofenac (FLECTOR) 1.3 % PTCH Place 1 patch onto the skin 2 (two) times daily. 08/07/15   Palumbo, April, MD  hydrochlorothiazide (MICROZIDE) 12.5 MG capsule Take 12.5 mg by mouth daily.    [provider]  HYDROcodone-acetaminophen (NORCO/VICODIN) 5-325 MG tablet Take 1-2 tablets every 6 hours as needed for severe pain 12/13/17   Renne Crigler, PA-C  HYDROcodone-acetaminophen (NORCO/VICODIN) 5-325 MG tablet Take 1 tablet by mouth every 6 (six) hours as needed for moderate pain or severe pain. 12/15/17   Lavera Guise, MD  lisinopril (PRINIVIL,ZESTRIL) 40 MG tablet  Take 20 mg by mouth daily.    [provider]  meloxicam (MOBIC) 15 MG tablet Take 15 mg by mouth daily.    [provider]  metoprolol succinate (TOPROL-XL) 25 MG 24 hr tablet Take 25 mg by mouth daily.    [provider]  NIFEdipine (PROCARDIA) 10 MG capsule Take 10 mg by mouth 3 (three) times daily.    [provider]  tiZANidine (ZANAFLEX) 4 MG tablet Take 4 mg by mouth every 6 (six) hours as needed.    [provider]    Family History History reviewed. No pertinent family history.  Social History Social History   Tobacco Use  . Smoking status: Current Every Day Smoker    Types: Cigarettes  . Smokeless tobacco: Never Used  Substance Use Topics  . Alcohol use: Yes    Comment: rare  . Drug use: Yes    Types: IV    Comment: Heroin     Allergies   Patient has no known allergies.   Review of Systems Review of Systems  Constitutional: Negative for chills and fever.  Respiratory: Negative for shortness of breath.   Gastrointestinal: Negative for vomiting.  Skin: Negative for rash.  Neurological: Negative for numbness.     Physical Exam Updated Vital Signs BP (!) 132/104 (BP Location: Right Arm)   Pulse 76  Temp 98.9 F (37.2 C) (Oral)   Resp 18   Ht 1.829 m (6')   Wt 81.6 kg (180 lb)   SpO2 100%   BMI 24.41 kg/m   Physical Exam  Constitutional: He is oriented to person, place, and time. He appears well-developed and well-nourished. No distress.  HENT:  Head: Atraumatic.  Eyes: Conjunctivae are normal.  Neck: Neck supple. No tracheal deviation present.  Cardiovascular: Intact distal pulses.  Pulmonary/Chest: Effort normal. No accessory muscle usage. No respiratory distress.  Musculoskeletal:  Localized abscess to right antecubital area. No cellulitis. Distal pulses palp. No crepitus to area, no signif sts.   Neurological: He is alert and oriented to person, place, and time.  Skin: Skin is warm and dry. No rash  noted. He is not diaphoretic.  Psychiatric: He has a normal mood and affect.  Nursing note and vitals reviewed.    ED Treatments / Results  Labs (all labs ordered are listed, but only abnormal results are displayed) Labs Reviewed - No data to display  EKG  EKG Interpretation None       Radiology No results found.  Procedures .Marland Kitchen.Incision and Drainage Date/Time: 02/12/2018 10:21 AM Performed by: Cathren LaineSteinl, Ariez Neilan, MD Authorized by: Cathren LaineSteinl, Wataru Mccowen, MD   Location:    Type:  Abscess   Location:  Upper extremity   Upper extremity location:  Arm   Arm location:  R lower arm Pre-procedure details:    Skin preparation:  Betadine Anesthesia (see MAR for exact dosages):    Anesthesia method:  Local infiltration   Local anesthetic:  Lidocaine 2% WITH epi Procedure type:    Complexity:  Complex Procedure details:    Incision types:  Elliptical   Incision depth:  Subcutaneous   Scalpel blade:  11   Wound management:  Probed and deloculated and irrigated with saline   Drainage:  Purulent   Drainage amount:  Moderate   Wound treatment:  Wound left open and drain placed   Packing materials:  1/4 in iodoform gauze Post-procedure details:    Patient tolerance of procedure:  Tolerated well, no immediate complications   (including critical care time)  Medications Ordered in ED Medications  lidocaine-EPINEPHrine (XYLOCAINE W/EPI) 2 %-1:200000 (PF) injection 10 mL (not administered)     Initial Impression / Assessment and Plan / ED Course  I have reviewed the triage vital signs and the nursing notes.  Pertinent labs & imaging results that were available during my care of the patient were reviewed by me and considered in my medical decision making (see chart for details).  Reviewed nursing notes and prior charts for additional history.   Prior abscess to area lateral to current - pt reports that resolved w I and D.    No fevers, pt does not appears systemically ill.  I and D in  ED.  Sterile dressing.   Will provide resource guide for substance abuse/outpatient tx options.     Final Clinical Impressions(s) / ED Diagnoses   Final diagnoses:  None    ED Discharge Orders    None       Cathren LaineSteinl, Dody Smartt, MD 02/12/18 1021

## 2018-07-17 ENCOUNTER — Encounter (HOSPITAL_BASED_OUTPATIENT_CLINIC_OR_DEPARTMENT_OTHER): Payer: Self-pay | Admitting: Emergency Medicine

## 2018-07-17 ENCOUNTER — Other Ambulatory Visit: Payer: Self-pay

## 2018-07-17 ENCOUNTER — Emergency Department (HOSPITAL_BASED_OUTPATIENT_CLINIC_OR_DEPARTMENT_OTHER)
Admission: EM | Admit: 2018-07-17 | Discharge: 2018-07-17 | Disposition: A | Discharge status: Home / Self Care | Payer: Medicare Other | Attending: Emergency Medicine | Admitting: Emergency Medicine

## 2018-07-17 DIAGNOSIS — Y929 Unspecified place or not applicable: Secondary | ICD-10-CM | POA: Diagnosis not present

## 2018-07-17 DIAGNOSIS — Y939 Activity, unspecified: Secondary | ICD-10-CM | POA: Diagnosis not present

## 2018-07-17 DIAGNOSIS — I10 Essential (primary) hypertension: Secondary | ICD-10-CM | POA: Diagnosis not present

## 2018-07-17 DIAGNOSIS — F17210 Nicotine dependence, cigarettes, uncomplicated: Secondary | ICD-10-CM | POA: Diagnosis not present

## 2018-07-17 DIAGNOSIS — I252 Old myocardial infarction: Secondary | ICD-10-CM | POA: Diagnosis not present

## 2018-07-17 DIAGNOSIS — X58XXXA Exposure to other specified factors, initial encounter: Secondary | ICD-10-CM | POA: Diagnosis not present

## 2018-07-17 DIAGNOSIS — R202 Paresthesia of skin: Secondary | ICD-10-CM | POA: Insufficient documentation

## 2018-07-17 DIAGNOSIS — Y999 Unspecified external cause status: Secondary | ICD-10-CM | POA: Insufficient documentation

## 2018-07-17 DIAGNOSIS — S00521A Blister (nonthermal) of lip, initial encounter: Secondary | ICD-10-CM | POA: Diagnosis not present

## 2018-07-17 DIAGNOSIS — L089 Local infection of the skin and subcutaneous tissue, unspecified: Secondary | ICD-10-CM

## 2018-07-17 MED ORDER — CLINDAMYCIN HCL 300 MG PO CAPS: 300.0000 mg | ORAL_CAPSULE | Freq: Two times a day (BID) | ORAL | 0 refills | Status: AC

## 2018-07-17 MED ORDER — IBUPROFEN 800 MG PO TABS: 800.0000 mg | ORAL_TABLET | Freq: Three times a day (TID) | ORAL | 0 refills | Status: AC | PRN

## 2018-07-17 MED FILL — CLINDAMYCIN HCL 300 MG CAP: 300 | 7 days supply | Qty: 14 | Fill #0

## 2018-07-17 MED FILL — IBUPROFEN 800 MG TAB: 800 | 7 days supply | Qty: 21 | Fill #0

## 2018-07-17 NOTE — ED Provider Notes (Signed)
Emergency Department Provider Note   I have reviewed the triage vital signs and the nursing notes.   HISTORY  Chief Complaint Knee Pain and Facial Swelling   HPI Thomas White is a 62 y.o. male with PMH of HLD, HTN, and CAD's to the emergency department for evaluation of tingling/pins-and-needles sensation in the right leg.  He states the sensation starts abruptly just below the knee and goes to the feet.  He has tingling throughout the entire leg but denies swelling.  Denies any weakness or gait instability.  No symptoms in the upper leg or lower back.  No arm or face symptoms.  Patient is also concerned regarding a swollen lower lip.  He states he may have bit it and feels like there may be some drainage coming from the lip.  He has been compliant with his medications including lisinopril.  Denies any throat tightness, tongue swelling, shortness of breath.  Past Medical History:  Diagnosis Date  . Drug abuse, IV (HCC)   . Hepatitis C   . High cholesterol   . Hypertension   . MI (myocardial infarction) (HCC)     There are no active problems to display for this patient.   Past Surgical History:  Procedure Laterality Date  . HERNIA REPAIR      Allergies Patient has no known allergies.  History reviewed. No pertinent family history.  Social History Social History   Tobacco Use  . Smoking status: Current Every Day Smoker    Types: Cigarettes  . Smokeless tobacco: Never Used  Substance Use Topics  . Alcohol use: Yes    Comment: rare  . Drug use: Yes    Types: IV    Comment: Heroin    Review of Systems  Constitutional: No fever/chills Eyes: No visual changes. ENT: No sore throat. Positive lower lip swelling.  Cardiovascular: Denies chest pain. Respiratory: Denies shortness of breath. Gastrointestinal: No abdominal pain. No nausea, no vomiting.  No diarrhea.  No constipation. Genitourinary: Negative for dysuria. Musculoskeletal: Negative for back  pain. Skin: Negative for rash. Neurological: Negative for headaches, focal weakness or numbness. Positive right lower leg tingling from the knee down.   10-point ROS otherwise negative.  ____________________________________________   PHYSICAL EXAM:  VITAL SIGNS: ED Triage Vitals  Enc Vitals Group     BP 07/17/18 1708 107/63     Pulse Rate 07/17/18 1708 98     Resp 07/17/18 1708 18     Temp 07/17/18 1708 98.1 F (36.7 C)     Temp src --      SpO2 07/17/18 1708 98 %     Weight 07/17/18 1707 170 lb (77.1 kg)     Height 07/17/18 1707 5\' 11"  (1.803 m)     Pain Score 07/17/18 1706 10   Constitutional: Alert and oriented. Well appearing and in no acute distress. Eyes: Conjunctivae are normal.  Head: Atraumatic. Nose: No congestion/rhinnorhea. Mouth/Throat: Mucous membranes are moist.  Oropharynx non-erythematous. Lower lip with edema but no evidence of abscess. Small mucosal abrasion with whitish discharge. Not able to express fluid from lip.  Neck: No stridor.  Cardiovascular: Normal rate, regular rhythm. Good peripheral circulation. Grossly normal heart sounds.   Respiratory: Normal respiratory effort.  No retractions. Lungs CTAB. Gastrointestinal: Soft and nontender. No distention.  Musculoskeletal: No lower extremity tenderness nor edema. No gross deformities of extremities. Neurologic:  Normal speech and language. Decreased sensation in the right lower extremity which is circumferential in distribution. No weakness. Normal  sensation at the knee and into the thigh. Normal CN exam 2-12. Normal upper extremity strength and sensation.  Skin:  Skin is warm, dry and intact. No rash noted.  ____________________________________________  RADIOLOGY  None ____________________________________________   PROCEDURES  Procedure(s) performed:   Procedures  None ____________________________________________   INITIAL IMPRESSION / ASSESSMENT AND PLAN / ED COURSE  Pertinent labs &  imaging results that were available during my care of the patient were reviewed by me and considered in my medical decision making (see chart for details).  Patient presents to the emergency department for evaluation of paresthesias in the right lower leg.  The distribution is a stocking distribution. Suspect that this represents a peripheral neuropathy versus compressive neuropathy.  Patient's lower lip is also swollen.  There does appear to be some mild discharge.  This may be an infectious process but no clear abscess in the lip.  I will discontinue the patient's lisinopril as a precaution in case the angioedema reaction was triggered by biting the lip.  I also placed the patient on clindamycin and give Motrin for pain.  I provided contact information for outpatient neurology and have encouraged him to call to schedule an appointment in the next week regarding his right leg.  At this time, I do not feel there is any life-threatening condition present. I have reviewed and discussed all results (EKG, imaging, lab, urine as appropriate), exam findings with patient. I have reviewed nursing notes and appropriate previous records.  I feel the patient is safe to be discharged home without further emergent workup. Discussed usual and customary return precautions. Patient and family (if present) verbalize understanding and are comfortable with this plan.  Patient will follow-up with their primary care provider. If they do not have a primary care provider, information for follow-up has been provided to them. All questions have been answered.  ____________________________________________  FINAL CLINICAL IMPRESSION(S) / ED DIAGNOSES  Final diagnoses:  Blister of lip with infection, initial encounter  Right leg paresthesias    NEW OUTPATIENT MEDICATIONS STARTED DURING THIS VISIT:  New Prescriptions   CLINDAMYCIN (CLEOCIN) 300 MG CAPSULE    Take 1 capsule (300 mg total) by mouth 2 (two) times daily for 7  days.   IBUPROFEN (ADVIL,MOTRIN) 800 MG TABLET    Take 1 tablet (800 mg total) by mouth every 8 (eight) hours as needed.    Note:  This document was prepared using Dragon voice recognition software and may include unintentional dictation errors.  Alona BeneJoshua Deshawn Witty, MD Emergency Medicine    Valma Rotenberg, Arlyss RepressJoshua G, MD 07/17/18 [phone]

## 2018-07-17 NOTE — ED Triage Notes (Addendum)
Patient reports right knee pain since Saturday.  Denies injury.  Also reports lower lip swelling since Monday. Patient maintaining secretions and speaking in full sentences without difficulty.

## 2018-07-17 NOTE — Discharge Instructions (Signed)
You were seen in the ED today with leg tingling and lip swelling. The lip appears to be cause by an infection and I am starting an antibiotic. I am going to stop your Lisinopril until you can be seen by your PCP to decide if re-starting is safe. Also, follow up with the Neurologist in the coming 1-2 weeks to discuss your leg tingling. Return to the ED with any numbness, weakness, or worsening lip swelling.

## 2018-07-17 NOTE — ED Notes (Signed)
ED Provider at bedside. 

## 2020-05-16 ENCOUNTER — Emergency Department (HOSPITAL_BASED_OUTPATIENT_CLINIC_OR_DEPARTMENT_OTHER)
Admission: EM | Admit: 2020-05-16 | Discharge: 2020-05-16 | Disposition: A | Discharge status: Home / Self Care | Payer: No Typology Code available for payment source | Attending: Emergency Medicine | Admitting: Emergency Medicine

## 2020-05-16 ENCOUNTER — Other Ambulatory Visit: Payer: Self-pay

## 2020-05-16 ENCOUNTER — Encounter (HOSPITAL_BASED_OUTPATIENT_CLINIC_OR_DEPARTMENT_OTHER): Payer: Self-pay | Admitting: Emergency Medicine

## 2020-05-16 ENCOUNTER — Emergency Department (HOSPITAL_BASED_OUTPATIENT_CLINIC_OR_DEPARTMENT_OTHER): Payer: No Typology Code available for payment source

## 2020-05-16 DIAGNOSIS — Y999 Unspecified external cause status: Secondary | ICD-10-CM | POA: Diagnosis not present

## 2020-05-16 DIAGNOSIS — Y9389 Activity, other specified: Secondary | ICD-10-CM | POA: Diagnosis not present

## 2020-05-16 DIAGNOSIS — Z79899 Other long term (current) drug therapy: Secondary | ICD-10-CM | POA: Diagnosis not present

## 2020-05-16 DIAGNOSIS — R0789 Other chest pain: Secondary | ICD-10-CM | POA: Insufficient documentation

## 2020-05-16 DIAGNOSIS — I10 Essential (primary) hypertension: Secondary | ICD-10-CM | POA: Insufficient documentation

## 2020-05-16 DIAGNOSIS — Z7982 Long term (current) use of aspirin: Secondary | ICD-10-CM | POA: Diagnosis not present

## 2020-05-16 DIAGNOSIS — Y92008 Other place in unspecified non-institutional (private) residence as the place of occurrence of the external cause: Secondary | ICD-10-CM | POA: Insufficient documentation

## 2020-05-16 DIAGNOSIS — W1789XA Other fall from one level to another, initial encounter: Secondary | ICD-10-CM | POA: Diagnosis not present

## 2020-05-16 DIAGNOSIS — F17210 Nicotine dependence, cigarettes, uncomplicated: Secondary | ICD-10-CM | POA: Diagnosis not present

## 2020-05-16 DIAGNOSIS — R0781 Pleurodynia: Secondary | ICD-10-CM

## 2020-05-16 DIAGNOSIS — R079 Chest pain, unspecified: Secondary | ICD-10-CM | POA: Diagnosis present

## 2020-05-16 MED ORDER — METHOCARBAMOL 500 MG PO TABS
500.0000 mg | ORAL_TABLET | Freq: Once | ORAL | Status: AC
  Administered 2020-05-16: 500 mg via ORAL
  Filled 2020-05-16: qty 1

## 2020-05-16 MED ORDER — CYCLOBENZAPRINE HCL 10 MG PO TABS: 10.0000 mg | ORAL_TABLET | Freq: Two times a day (BID) | ORAL | 0 refills | Status: AC | PRN

## 2020-05-16 MED ORDER — OXYCODONE-ACETAMINOPHEN 5-325 MG PO TABS
1.0000 | ORAL_TABLET | Freq: Once | ORAL | Status: AC
  Administered 2020-05-16: 1 via ORAL
  Filled 2020-05-16: qty 1

## 2020-05-16 MED ORDER — KETOROLAC TROMETHAMINE 60 MG/2ML IM SOLN
30.0000 mg | Freq: Once | INTRAMUSCULAR | Status: AC
  Administered 2020-05-16: 30 mg via INTRAMUSCULAR
  Filled 2020-05-16: qty 2

## 2020-05-16 NOTE — Discharge Instructions (Signed)
Recommend taking Tylenol, Motrin or naproxen and prescribed Flexeril for pain control.  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  Use the incentive spirometer as discussed.  Recommend close recheck with your primary doctor this coming week.  If you develop difficulty in breathing, fever or other new concerns symptom, recommend return to ER for reassessment.

## 2020-05-16 NOTE — ED Triage Notes (Signed)
Pt has right sided chest tenderness from a fall off his porch Tuesday. States it just is not getting any better and it hurts most with movement.

## 2020-05-17 NOTE — ED Provider Notes (Signed)
[facility] EMERGENCY DEPARTMENT Provider Note   CSN: [id number] Arrival date & time: 05/16/20  5732     History Chief Complaint  Patient presents with  . Chest Pain  . Fall    Thomas White is a 64 y.o. male.  Presents to ER with concern for pain on the right side of his ribs after fall.  Fall occurred Tuesday, fell off porch, denies any significant height, landed on right side.  Denies any difficulty in breathing, states pain is worse with certain movements, worse with trying to take a deep breath.  Has not taken any medication for this.  Denies any head trauma, no neck or back pain.  Past medical history notable for hep C, hypertension, MI.  Smoking tobacco.  HPI     Past Medical History:  Diagnosis Date  . Drug abuse, IV (HCC)   . Hepatitis C   . High cholesterol   . Hypertension   . MI (myocardial infarction) (HCC)     There are no problems to display for this patient.   Past Surgical History:  Procedure Laterality Date  . HERNIA REPAIR         History reviewed. No pertinent family history.  Social History   Tobacco Use  . Smoking status: Current Every Day Smoker    Types: Cigarettes  . Smokeless tobacco: Never Used  Substance Use Topics  . Alcohol use: Yes    Comment: rare  . Drug use: Yes    Types: IV    Comment: Heroin    Home Medications Prior to Admission medications   Medication Sig Start Date End Date Taking? Authorizing Provider  aspirin 81 MG tablet Take 81 mg by mouth daily.    [provider]  cyclobenzaprine (FLEXERIL) 10 MG tablet Take 10 mg by mouth 3 (three) times daily as needed for muscle spasms.    [provider]  cyclobenzaprine (FLEXERIL) 10 MG tablet Take 1 tablet (10 mg total) by mouth 2 (two) times daily as needed for muscle spasms. 05/16/20   Milagros Loll, MD  diclofenac (FLECTOR) 1.3 % PTCH Place 1 patch onto the skin 2 (two) times daily. 08/07/15   Palumbo, April, MD  hydrochlorothiazide  (MICROZIDE) 12.5 MG capsule Take 12.5 mg by mouth daily.    [provider]  ibuprofen (ADVIL,MOTRIN) 800 MG tablet Take 1 tablet (800 mg total) by mouth every 8 (eight) hours as needed. 07/17/18   Long, Arlyss Repress, MD  meloxicam (MOBIC) 15 MG tablet Take 15 mg by mouth daily.    [provider]  metoprolol succinate (TOPROL-XL) 25 MG 24 hr tablet Take 25 mg by mouth daily.    [provider]  NIFEdipine (PROCARDIA) 10 MG capsule Take 10 mg by mouth 3 (three) times daily.    [provider]  tiZANidine (ZANAFLEX) 4 MG tablet Take 4 mg by mouth every 6 (six) hours as needed.    [provider]    Allergies    Patient has no known allergies.  Review of Systems   Review of Systems  Constitutional: Negative for chills and fever.  HENT: Negative for ear pain and sore throat.   Eyes: Negative for pain and visual disturbance.  Respiratory: Negative for cough and shortness of breath.   Cardiovascular: Positive for chest pain. Negative for palpitations.  Gastrointestinal: Negative for abdominal pain and vomiting.  Genitourinary: Negative for dysuria and hematuria.  Musculoskeletal: Negative for arthralgias and back pain.  Skin: Negative  for color change and rash.  Neurological: Negative for seizures and syncope.  All other systems reviewed and are negative.   Physical Exam Updated Vital Signs BP (!) 119/99 (BP Location: Right Arm)   Pulse 92   Temp 98.3 F (36.8 C) (Oral)   Resp 18   SpO2 97%   Physical Exam Vitals and nursing note reviewed.  Constitutional:      Appearance: He is well-developed.  HENT:     Head: Normocephalic and atraumatic.  Eyes:     Conjunctiva/sclera: Conjunctivae normal.  Cardiovascular:     Rate and Rhythm: Normal rate and regular rhythm.     Heart sounds: No murmur.  Pulmonary:     Effort: Pulmonary effort is normal. No respiratory distress.     Breath sounds: Normal breath sounds.  Chest:     Comments: There  is tenderness over the right anterior and right lateral chest wall, no crepitus or deformity noted Abdominal:     Palpations: Abdomen is soft.     Tenderness: There is no abdominal tenderness.     Comments: No tenderness throughout abdomen, bilateral flank  Musculoskeletal:     Cervical back: Neck supple.  Skin:    General: Skin is warm and dry.  Neurological:     Mental Status: He is alert.     ED Results / Procedures / Treatments   Labs (all labs ordered are listed, but only abnormal results are displayed) Labs Reviewed - No data to display  EKG None  Radiology DG Ribs Unilateral W/Chest Right  Result Date: 05/16/2020 CLINICAL DATA:  Fall 5 days ago with persistent right-sided chest pain, initial encounter EXAM: RIGHT RIBS AND CHEST - 3+ VIEW COMPARISON:  None. FINDINGS: Cardiac shadow is within normal limits. The lungs are well aerated bilaterally. No focal infiltrate or sizable effusion is seen. No acute rib fracture is noted. No soft tissue abnormality is seen. IMPRESSION: No evidence of acute rib fracture. Electronically Signed   By: Inez Catalina M.D.   On: 05/16/2020 10:45    Procedures Procedures (including critical care time)  Medications Ordered in ED Medications  oxyCODONE-acetaminophen (PERCOCET/ROXICET) 5-325 MG per tablet 1 tablet (1 tablet Oral Given 05/16/20 1111)  ketorolac (TORADOL) injection 30 mg (30 mg Intramuscular Given 05/16/20 1112)  methocarbamol (ROBAXIN) tablet 500 mg (500 mg Oral Given 05/16/20 1111)    ED Course  I have reviewed the triage vital signs and the nursing notes.  Pertinent labs & imaging results that were available during my care of the patient were reviewed by me and considered in my medical decision making (see chart for details).    MDM Rules/Calculators/A&P                     64 year old male presents to ER after concern for trauma to right chest wall.  On exam, patient is well-appearing, stable vital signs, no tachypnea or  accessory muscle use.  CXR with rib negative for fracture, PTX, effusion.  Suspect MSK strain. No other trauma identified on exam or on hx.  Provided pain control, incentive spirometer.  Reviewed return precautions and discharged home.    After the discussed management above, the patient was determined to be safe for discharge.  The patient was in agreement with this plan and all questions regarding their care were answered.  ED return precautions were discussed and the patient will return to the ED with any significant worsening of condition.    Final Clinical Impression(s) /  ED Diagnoses Final diagnoses:  Rib pain on right side    Rx / DC Orders ED Discharge Orders         Ordered    cyclobenzaprine (FLEXERIL) 10 MG tablet  2 times daily PRN     05/16/20 1116           Milagros Loll, MD 05/17/20 [phone]

## 2020-07-16 ENCOUNTER — Other Ambulatory Visit: Payer: Self-pay

## 2020-07-16 ENCOUNTER — Encounter (HOSPITAL_BASED_OUTPATIENT_CLINIC_OR_DEPARTMENT_OTHER): Payer: Self-pay

## 2020-07-16 ENCOUNTER — Emergency Department (HOSPITAL_BASED_OUTPATIENT_CLINIC_OR_DEPARTMENT_OTHER)
Admission: EM | Admit: 2020-07-16 | Discharge: 2020-07-16 | Disposition: A | Discharge status: Home / Self Care | Payer: No Typology Code available for payment source | Attending: Emergency Medicine | Admitting: Emergency Medicine

## 2020-07-16 DIAGNOSIS — Z5321 Procedure and treatment not carried out due to patient leaving prior to being seen by health care provider: Secondary | ICD-10-CM | POA: Insufficient documentation

## 2020-07-16 DIAGNOSIS — Y939 Activity, unspecified: Secondary | ICD-10-CM | POA: Insufficient documentation

## 2020-07-16 DIAGNOSIS — M79602 Pain in left arm: Secondary | ICD-10-CM | POA: Insufficient documentation

## 2020-07-16 DIAGNOSIS — M549 Dorsalgia, unspecified: Secondary | ICD-10-CM | POA: Insufficient documentation

## 2020-07-16 DIAGNOSIS — Y929 Unspecified place or not applicable: Secondary | ICD-10-CM | POA: Insufficient documentation

## 2020-07-16 DIAGNOSIS — M79605 Pain in left leg: Secondary | ICD-10-CM | POA: Insufficient documentation

## 2020-07-16 DIAGNOSIS — Y999 Unspecified external cause status: Secondary | ICD-10-CM | POA: Insufficient documentation

## 2020-07-16 NOTE — ED Triage Notes (Signed)
Called pt's name in lobby and out front door with no response.  °
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
## 2020-09-24 ENCOUNTER — Encounter (HOSPITAL_BASED_OUTPATIENT_CLINIC_OR_DEPARTMENT_OTHER): Payer: Self-pay | Admitting: *Deleted

## 2020-09-24 ENCOUNTER — Emergency Department (HOSPITAL_BASED_OUTPATIENT_CLINIC_OR_DEPARTMENT_OTHER)
Admission: EM | Admit: 2020-09-24 | Discharge: 2020-09-24 | Disposition: A | Discharge status: Home / Self Care | Payer: No Typology Code available for payment source | Attending: Emergency Medicine | Admitting: Emergency Medicine

## 2020-09-24 ENCOUNTER — Other Ambulatory Visit: Payer: Self-pay

## 2020-09-24 DIAGNOSIS — R2231 Localized swelling, mass and lump, right upper limb: Secondary | ICD-10-CM | POA: Diagnosis not present

## 2020-09-24 DIAGNOSIS — M79644 Pain in right finger(s): Secondary | ICD-10-CM | POA: Diagnosis not present

## 2020-09-24 DIAGNOSIS — Z5321 Procedure and treatment not carried out due to patient leaving prior to being seen by health care provider: Secondary | ICD-10-CM | POA: Insufficient documentation

## 2020-09-24 DIAGNOSIS — M79641 Pain in right hand: Secondary | ICD-10-CM | POA: Diagnosis present

## 2020-09-24 NOTE — ED Triage Notes (Signed)
Pain and swelling to his right thumb that started a week after getting a manicure.

## 2021-06-24 DEATH — deceased

## 2021-11-15 IMAGING — CR DG RIBS W/ CHEST 3+V*R*
4 series · 4 of 4 positions shown · non-contrast
Comparison: None.

CLINICAL DATA: Fall 5 days ago with persistent right-sided chest
pain, initial encounter

EXAM:
RIGHT RIBS AND CHEST - 3+ VIEW

[w chest pa]
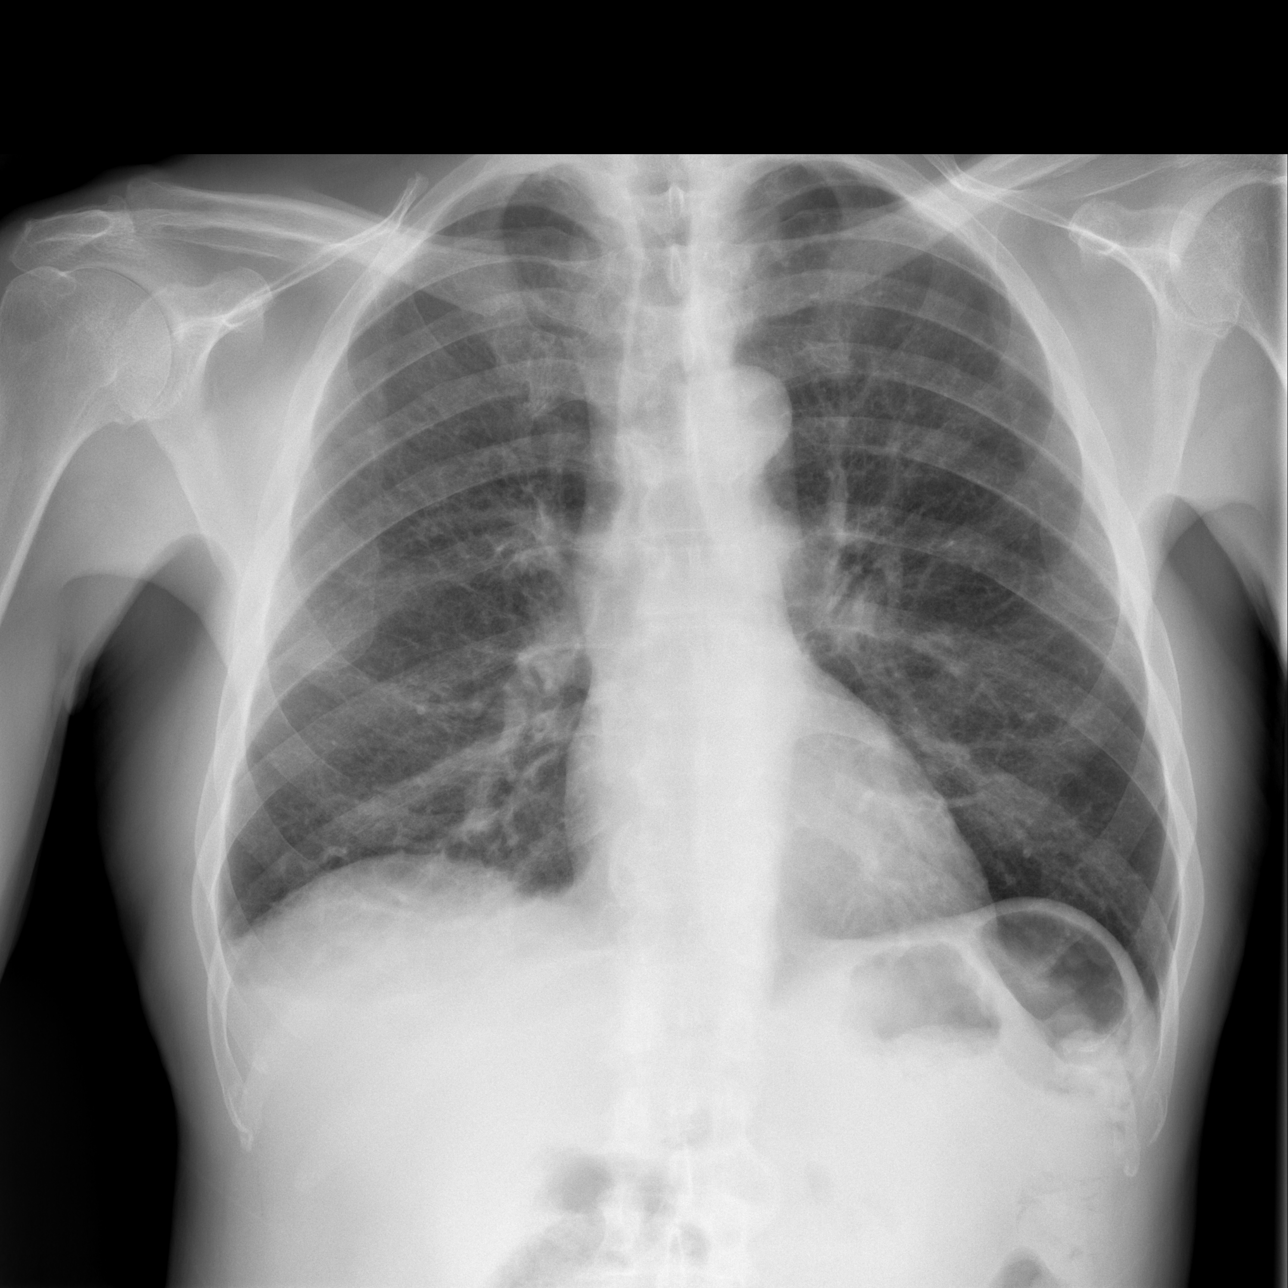

[w ribs ap/pa upper right]
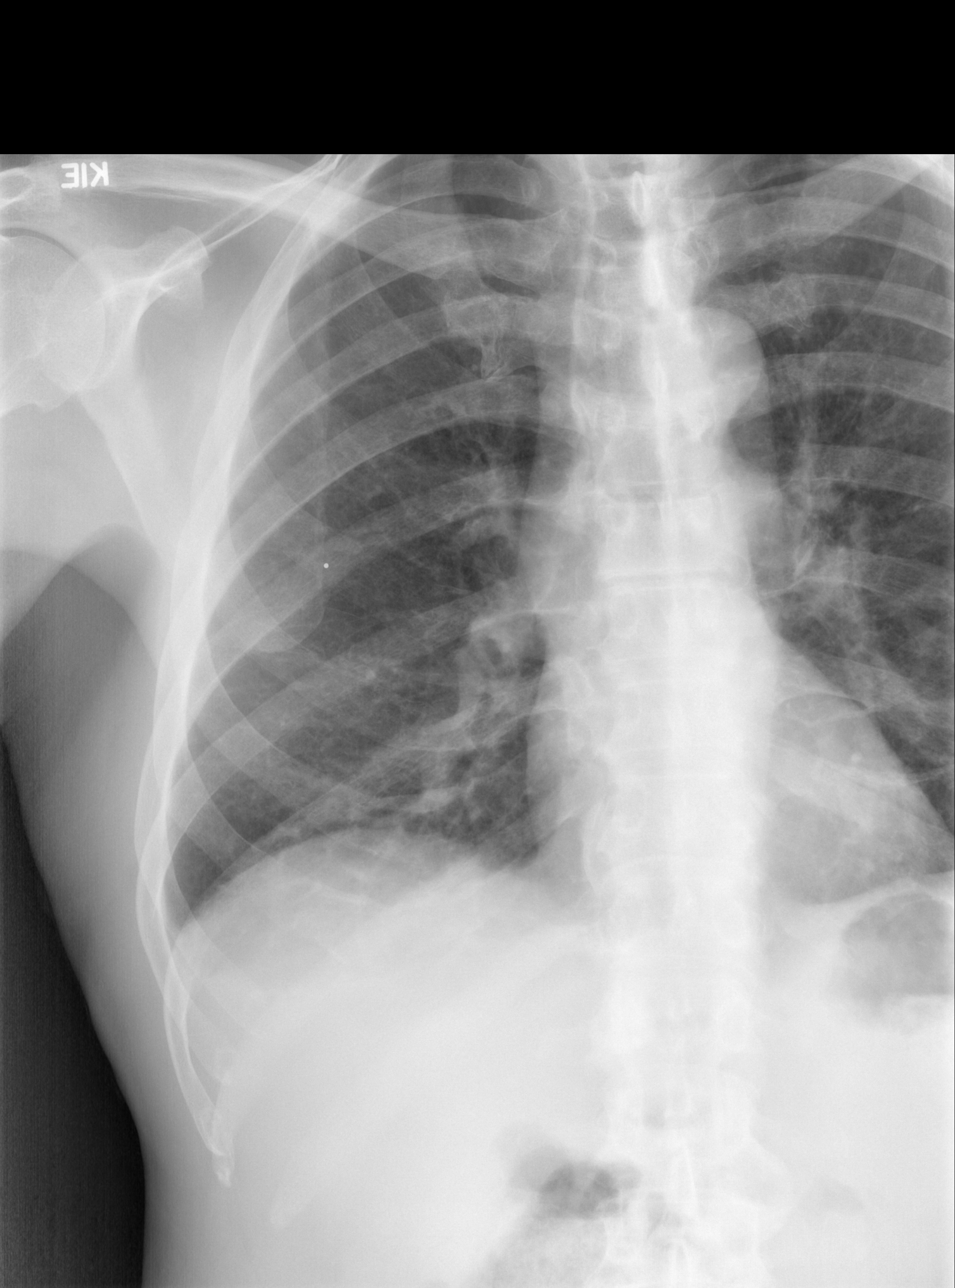

[w ribs ap/pa lower right]
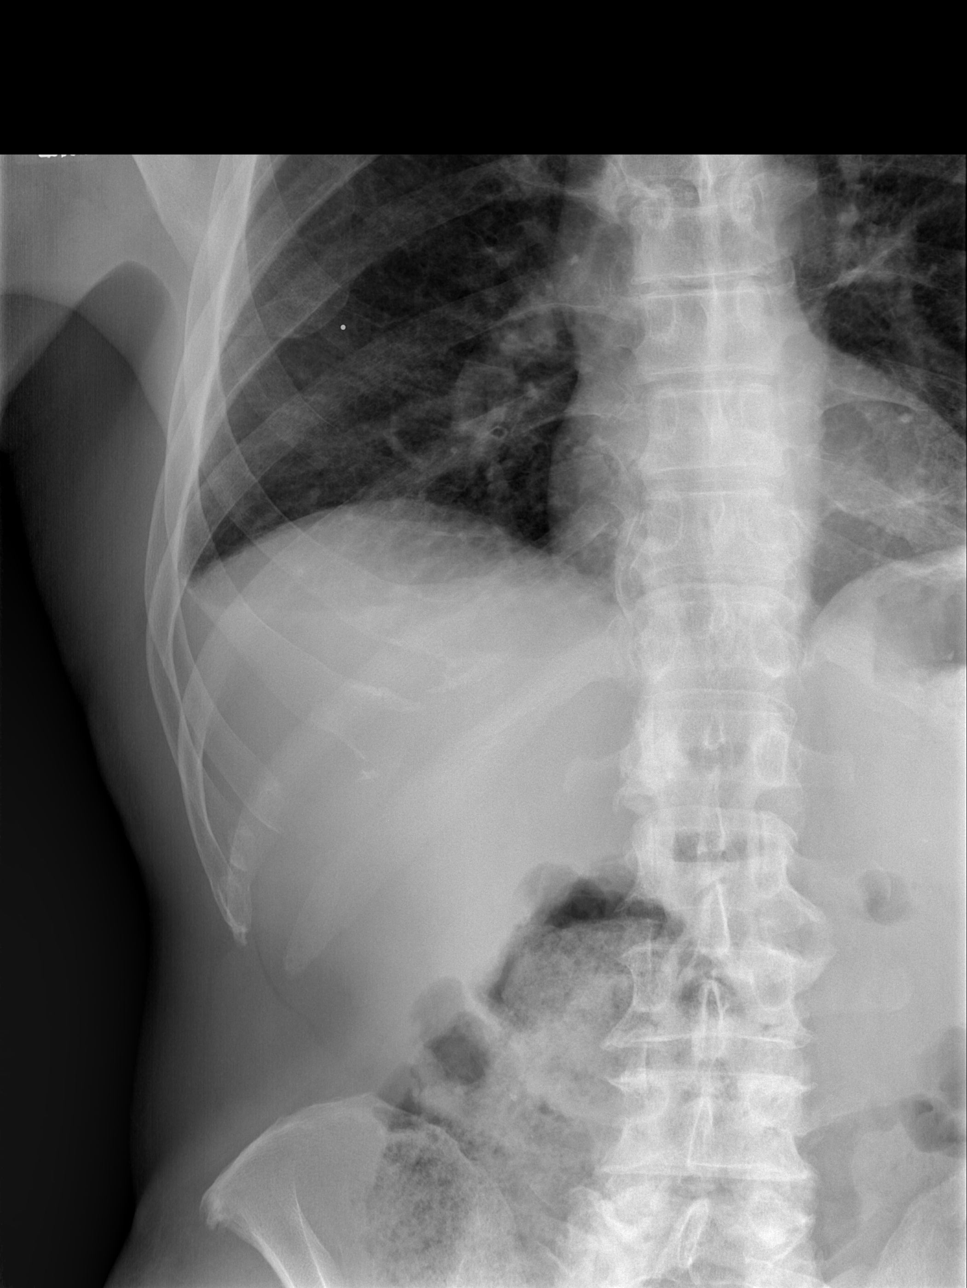

[w ribs oblique right]
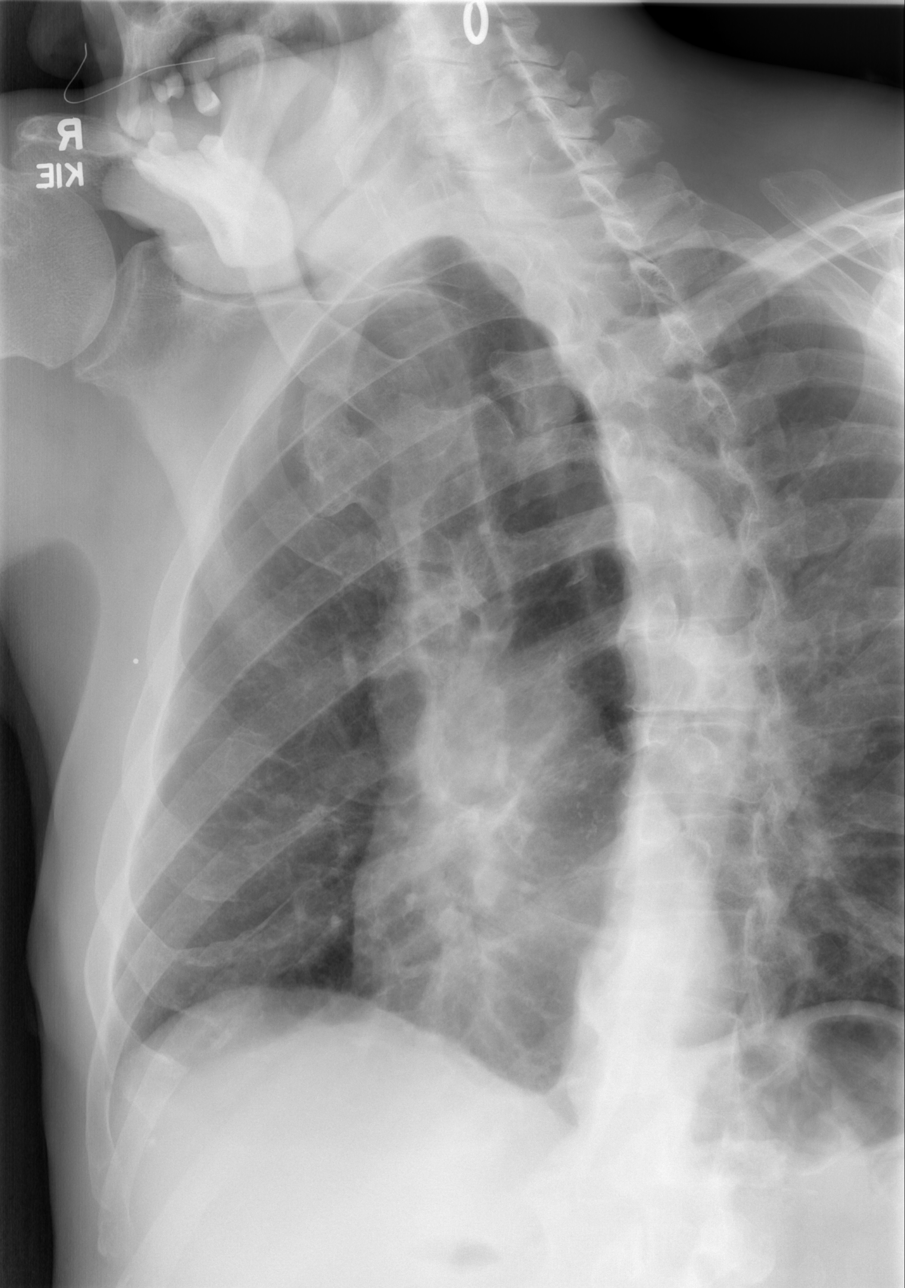

[4 of 4 positions shown; findings below may reference images not displayed]

FINDINGS: Cardiac shadow is within normal limits. The lungs are well aerated
bilaterally. No focal infiltrate or sizable effusion is seen. No
acute rib fracture is noted. No soft tissue abnormality is seen.
IMPRESSION: No evidence of acute rib fracture.
